# Patient Record
Sex: Male | Born: 1960 | Hispanic: No | Marital: Single | Smoking: Current every day smoker
Health system: Southern US, Community
[De-identification: ages and names within clinical notes are randomized; demographics above are authoritative.]

## PROBLEM LIST (undated history)

## (undated) DIAGNOSIS — R51 Headache: Secondary | ICD-10-CM

## (undated) DIAGNOSIS — J449 Chronic obstructive pulmonary disease, unspecified: Secondary | ICD-10-CM

## (undated) DIAGNOSIS — E119 Type 2 diabetes mellitus without complications: Secondary | ICD-10-CM

## (undated) DIAGNOSIS — I639 Cerebral infarction, unspecified: Secondary | ICD-10-CM

## (undated) DIAGNOSIS — T1490XA Injury, unspecified, initial encounter: Secondary | ICD-10-CM

## (undated) DIAGNOSIS — Z720 Tobacco use: Secondary | ICD-10-CM

## (undated) DIAGNOSIS — F1010 Alcohol abuse, uncomplicated: Secondary | ICD-10-CM

## (undated) HISTORY — DX: Headache: R51

## (undated) HISTORY — DX: Chronic obstructive pulmonary disease, unspecified: J44.9

## (undated) HISTORY — PX: ELBOW SURGERY: SHX618

## (undated) HISTORY — PX: HAND SURGERY: SHX662

## (undated) HISTORY — PX: VASECTOMY: SHX75

## (undated) HISTORY — DX: Cerebral infarction, unspecified: I63.9

## (undated) HISTORY — PX: SHOULDER SURGERY: SHX246

---

## 2002-08-12 ENCOUNTER — Emergency Department (HOSPITAL_COMMUNITY): Admission: EM | Admit: 2002-08-12 | Discharge: 2002-08-12 | Payer: Self-pay | Admitting: Emergency Medicine

## 2002-08-12 ENCOUNTER — Encounter: Payer: Self-pay | Admitting: Emergency Medicine

## 2003-11-28 ENCOUNTER — Ambulatory Visit: Admission: RE | Admit: 2003-11-28 | Discharge: 2003-11-28 | Payer: Self-pay | Admitting: Emergency Medicine

## 2009-02-15 ENCOUNTER — Ambulatory Visit (HOSPITAL_COMMUNITY): Admission: RE | Admit: 2009-02-15 | Discharge: 2009-02-15 | Payer: Self-pay | Admitting: Orthopedic Surgery

## 2012-12-17 DIAGNOSIS — E119 Type 2 diabetes mellitus without complications: Secondary | ICD-10-CM

## 2012-12-17 HISTORY — DX: Type 2 diabetes mellitus without complications: E11.9

## 2013-07-22 ENCOUNTER — Emergency Department (HOSPITAL_COMMUNITY): Payer: 59

## 2013-07-22 ENCOUNTER — Inpatient Hospital Stay (HOSPITAL_COMMUNITY)
Admission: EM | Admit: 2013-07-22 | Discharge: 2013-07-25 | DRG: 065 | Disposition: A | Discharge status: Home / Self Care | Payer: 59 | Attending: Internal Medicine | Admitting: Internal Medicine

## 2013-07-22 ENCOUNTER — Encounter (HOSPITAL_COMMUNITY): Payer: Self-pay | Admitting: Physical Medicine and Rehabilitation

## 2013-07-22 ENCOUNTER — Inpatient Hospital Stay (HOSPITAL_COMMUNITY): Payer: 59

## 2013-07-22 DIAGNOSIS — E785 Hyperlipidemia, unspecified: Secondary | ICD-10-CM

## 2013-07-22 DIAGNOSIS — I6350 Cerebral infarction due to unspecified occlusion or stenosis of unspecified cerebral artery: Principal | ICD-10-CM | POA: Diagnosis present

## 2013-07-22 DIAGNOSIS — R4701 Aphasia: Secondary | ICD-10-CM | POA: Diagnosis present

## 2013-07-22 DIAGNOSIS — F449 Dissociative and conversion disorder, unspecified: Secondary | ICD-10-CM | POA: Diagnosis present

## 2013-07-22 DIAGNOSIS — R4789 Other speech disturbances: Secondary | ICD-10-CM | POA: Diagnosis present

## 2013-07-22 DIAGNOSIS — F1010 Alcohol abuse, uncomplicated: Secondary | ICD-10-CM | POA: Diagnosis present

## 2013-07-22 DIAGNOSIS — I739 Peripheral vascular disease, unspecified: Secondary | ICD-10-CM | POA: Diagnosis present

## 2013-07-22 DIAGNOSIS — I6340 Cerebral infarction due to embolism of unspecified cerebral artery: Secondary | ICD-10-CM | POA: Diagnosis present

## 2013-07-22 DIAGNOSIS — R531 Weakness: Secondary | ICD-10-CM

## 2013-07-22 DIAGNOSIS — F3289 Other specified depressive episodes: Secondary | ICD-10-CM | POA: Diagnosis present

## 2013-07-22 DIAGNOSIS — M6281 Muscle weakness (generalized): Secondary | ICD-10-CM

## 2013-07-22 DIAGNOSIS — Z823 Family history of stroke: Secondary | ICD-10-CM

## 2013-07-22 DIAGNOSIS — F17200 Nicotine dependence, unspecified, uncomplicated: Secondary | ICD-10-CM | POA: Diagnosis present

## 2013-07-22 DIAGNOSIS — E119 Type 2 diabetes mellitus without complications: Secondary | ICD-10-CM | POA: Diagnosis present

## 2013-07-22 DIAGNOSIS — F329 Major depressive disorder, single episode, unspecified: Secondary | ICD-10-CM | POA: Diagnosis present

## 2013-07-22 DIAGNOSIS — R29898 Other symptoms and signs involving the musculoskeletal system: Secondary | ICD-10-CM | POA: Diagnosis present

## 2013-07-22 DIAGNOSIS — G8190 Hemiplegia, unspecified affecting unspecified side: Secondary | ICD-10-CM | POA: Diagnosis present

## 2013-07-22 DIAGNOSIS — I639 Cerebral infarction, unspecified: Secondary | ICD-10-CM

## 2013-07-22 DIAGNOSIS — I6529 Occlusion and stenosis of unspecified carotid artery: Secondary | ICD-10-CM | POA: Diagnosis present

## 2013-07-22 HISTORY — DX: Alcohol abuse, uncomplicated: F10.10

## 2013-07-22 HISTORY — DX: Injury, unspecified, initial encounter: T14.90XA

## 2013-07-22 HISTORY — DX: Tobacco use: Z72.0

## 2013-07-22 LAB — COMPREHENSIVE METABOLIC PANEL
ALT: 67 U/L — ABNORMAL HIGH (ref 0–53)
ALT: 66 U/L — ABNORMAL HIGH (ref 0–53)
AST: 32 U/L (ref 0–37)
AST: 35 U/L (ref 0–37)
Albumin: 4.1 g/dL (ref 3.5–5.2)
Albumin: 4.2 g/dL (ref 3.5–5.2)
Alkaline Phosphatase: 85 U/L (ref 39–117)
Alkaline Phosphatase: 84 U/L (ref 39–117)
BUN: 7 mg/dL (ref 6–23)
BUN: 7 mg/dL (ref 6–23)
CO2: 25 mEq/L (ref 19–32)
CO2: 25 mEq/L (ref 19–32)
Calcium: 9.5 mg/dL (ref 8.4–10.5)
Calcium: 9.6 mg/dL (ref 8.4–10.5)
Chloride: 100 mEq/L (ref 96–112)
Chloride: 100 mEq/L (ref 96–112)
Creatinine, Ser: 0.64 mg/dL (ref 0.50–1.35)
Creatinine, Ser: 0.64 mg/dL (ref 0.50–1.35)
GFR calc Af Amer: >90 mL/min (ref >90)
GFR calc Af Amer: >90 mL/min (ref >90)
GFR calc non Af Amer: >90 mL/min (ref >90)
GFR calc non Af Amer: >90 mL/min (ref >90)
Glucose, Bld: 208 mg/dL — ABNORMAL HIGH (ref 70–99)
Glucose, Bld: 213 mg/dL — ABNORMAL HIGH (ref 70–99)
Potassium: 3.6 mEq/L (ref 3.5–5.1)
Potassium: 3.5 mEq/L (ref 3.5–5.1)
Sodium: 137 mEq/L (ref 135–145)
Sodium: 138 mEq/L (ref 135–145)
Total Bilirubin: 0.3 mg/dL (ref 0.3–1.2)
Total Bilirubin: 0.3 mg/dL (ref 0.3–1.2)
Total Protein: 7.1 g/dL (ref 6.0–8.3)
Total Protein: 7.2 g/dL (ref 6.0–8.3)

## 2013-07-22 LAB — DIFFERENTIAL
Basophils Absolute: 0.1 10*3/uL (ref 0.0–0.1)
Basophils Relative: 1 % (ref 0–1)
Eosinophils Absolute: 0.3 10*3/uL (ref 0.0–0.7)
Eosinophils Relative: 3 % (ref 0–5)
Lymphocytes Relative: 36 % (ref 12–46)
Lymphs Abs: 3 10*3/uL (ref 0.7–4.0)
Monocytes Absolute: 0.6 10*3/uL (ref 0.1–1.0)
Monocytes Relative: 7 % (ref 3–12)
Neutro Abs: 4.4 10*3/uL (ref 1.7–7.7)
Neutrophils Relative %: 53 % (ref 43–77)

## 2013-07-22 LAB — POCT I-STAT TROPONIN I: Troponin i, poc: 0 ng/mL (ref 0.00–0.08)

## 2013-07-22 LAB — RAPID URINE DRUG SCREEN, HOSP PERFORMED
Amphetamines: NOT DETECTED
Barbiturates: NOT DETECTED
Benzodiazepines: NOT DETECTED
Cocaine: NOT DETECTED
Opiates: POSITIVE — AB
Tetrahydrocannabinol: NOT DETECTED

## 2013-07-22 LAB — TSH: TSH: 1.777 u[IU]/mL (ref 0.350–4.500)

## 2013-07-22 LAB — CBC
HCT: 39.6 % (ref 39.0–52.0)
Hemoglobin: 13.9 g/dL (ref 13.0–17.0)
MCH: 32.9 pg (ref 26.0–34.0)
MCHC: 35.1 g/dL (ref 30.0–36.0)
MCV: 93.6 fL (ref 78.0–100.0)
Platelets: 151 10*3/uL (ref 150–400)
RBC: 4.23 MIL/uL (ref 4.22–5.81)
RDW: 13.2 % (ref 11.5–15.5)
WBC: 8.3 10*3/uL (ref 4.0–10.5)

## 2013-07-22 LAB — PROTIME-INR
INR: 0.96 (ref 0.00–1.49)
Prothrombin Time: 12.6 seconds (ref 11.6–15.2)

## 2013-07-22 LAB — TROPONIN I: Troponin I: <0.3 ng/mL (ref <0.30)

## 2013-07-22 LAB — APTT: aPTT: 32 seconds (ref 24–37)

## 2013-07-22 MED ORDER — THIAMINE HCL 100 MG/ML IJ SOLN
Freq: Once | INTRAVENOUS | Status: AC
  Administered 2013-07-22: 22:00:00 via INTRAVENOUS
  Filled 2013-07-22: qty 1000

## 2013-07-22 MED ORDER — LORAZEPAM 2 MG/ML IJ SOLN: 1.0000 mg | Freq: Four times a day (QID) | INTRAMUSCULAR | Status: DC | PRN

## 2013-07-22 MED ORDER — ACETAMINOPHEN 650 MG RE SUPP: 650.0000 mg | RECTAL | Status: DC | PRN

## 2013-07-22 MED ORDER — ASPIRIN 300 MG RE SUPP
300.0000 mg | Freq: Every day | RECTAL | Status: DC
  Filled 2013-07-22 (×2): qty 1

## 2013-07-22 MED ORDER — LORAZEPAM 1 MG PO TABS: 1.0000 mg | ORAL_TABLET | Freq: Four times a day (QID) | ORAL | Status: DC | PRN

## 2013-07-22 MED ORDER — ASPIRIN 300 MG RE SUPP
300.0000 mg | Freq: Once | RECTAL | Status: AC
  Administered 2013-07-22: 300 mg via RECTAL
  Filled 2013-07-22: qty 1

## 2013-07-22 MED ORDER — ASPIRIN 325 MG PO TABS
325.0000 mg | ORAL_TABLET | Freq: Every day | ORAL | Status: DC
Start: 2013-07-22 — End: 2013-07-22
  Filled 2013-07-22: qty 1

## 2013-07-22 MED ORDER — IOHEXOL 350 MG/ML SOLN
100.0000 mL | Freq: Once | INTRAVENOUS | Status: AC | PRN
  Administered 2013-07-22: 100 mL via INTRAVENOUS

## 2013-07-22 MED ORDER — SENNOSIDES-DOCUSATE SODIUM 8.6-50 MG PO TABS: 1.0000 | ORAL_TABLET | Freq: Every evening | ORAL | Status: DC | PRN

## 2013-07-22 MED ORDER — NICOTINE 21 MG/24HR TD PT24
21.0000 mg | MEDICATED_PATCH | Freq: Every day | TRANSDERMAL | Status: DC
  Administered 2013-07-22 – 2013-07-25 (×4): 21 mg via TRANSDERMAL
  Filled 2013-07-22 (×4): qty 1

## 2013-07-22 MED ORDER — ACETAMINOPHEN 325 MG PO TABS
650.0000 mg | ORAL_TABLET | ORAL | Status: DC | PRN
  Administered 2013-07-23 – 2013-07-25 (×4): 650 mg via ORAL
  Filled 2013-07-22 (×4): qty 2

## 2013-07-22 NOTE — ED Provider Notes (Signed)
CSN: [id number]     Arrival date & time 07/22/13  0904 History     First MD Initiated Contact with Patient 07/22/13 0945     Chief Complaint  Patient presents with  . Headache  . Extremity Weakness    HPI Patient brought in by EMS after he began having confusion and right-sided weakness.  Friend of his noticed he had weakness in his right leg this morning when he came out of the car.  He then became more weak on the right side and had confusion and change in his speech.  History is difficult to obtain because of patient's inability to communicate.  No past medical history is known at this time. No past medical history on file. No past surgical history on file. History reviewed. No pertinent family history. History  Substance Use Topics  . Smoking status: Never Smoker   . Smokeless tobacco: Not on file  . Alcohol Use: No    Review of Systems  Unable to perform ROS: Mental status change    Allergies  Review of patient's allergies indicates no known allergies.  Home Medications   Current Outpatient Rx  Name  Route  Sig  Dispense  Refill  . Ascorbic Acid (VITAMIN C) 500 MG CAPS   Oral   Take 500 mg by mouth daily.         . B Complex-C (B-COMPLEX WITH VITAMIN C) tablet   Oral   Take 1 tablet by mouth daily.         Marland Kitchen HYDROcodone-acetaminophen (NORCO) 10-325 MG per tablet   Oral   Take 1 tablet by mouth every 3 (three) hours as needed for pain.         . meloxicam (MOBIC) 15 MG tablet   Oral   Take 15 mg by mouth daily.         . traZODone (DESYREL) 50 MG tablet   Oral   Take 50 mg by mouth at bedtime.         . vitamin E 400 UNIT capsule   Oral   Take 400 Units by mouth daily.          BP 152/81  Pulse 82  Temp(Src) 98.8 F (37.1 C) (Oral)  Resp 17  SpO2 100% Physical Exam  Nursing note and vitals reviewed. Constitutional: He appears well-developed and well-nourished. No distress.  HENT:  Head: Normocephalic and atraumatic.  Eyes: Pupils  are equal, round, and reactive to light.  Neck: Normal range of motion.  Cardiovascular: Normal rate and intact distal pulses.   Pulmonary/Chest: No respiratory distress.  Abdominal: Normal appearance. He exhibits no distension.  Musculoskeletal: Normal range of motion.  Neurological: He is alert. A cranial nerve deficit (Right-sided facial droop.) is present. GCS eye subscore is 4. GCS verbal subscore is 5. GCS motor subscore is 6.  Right-sided hemiparesis.  Skin: Skin is warm and dry. No rash noted.  Psychiatric: He has a normal mood and affect. His behavior is normal.    ED Course   Procedures (including critical care time) CRITICAL CARE Performed by: Nelva Nay L Total critical care time: 30 min Critical care time was exclusive of separately billable procedures and treating other patients. Critical care was necessary to treat or prevent imminent or life-threatening deterioration. Critical care was time spent personally by me on the following activities: development of treatment plan with patient and/or surrogate as well as nursing, discussions with consultants, evaluation of patient's response to treatment, examination of patient, obtaining  history from patient or surrogate, ordering and performing treatments and interventions, ordering and review of laboratory studies, ordering and review of radiographic studies, pulse oximetry and re-evaluation of patient's condition.  Labs Reviewed  COMPREHENSIVE METABOLIC PANEL - Abnormal; Notable for the following:    Glucose, Bld 208 (*)    ALT 67 (*)    All other components within normal limits  COMPREHENSIVE METABOLIC PANEL - Abnormal; Notable for the following:    Glucose, Bld 213 (*)    ALT 66 (*)    All other components within normal limits  PROTIME-INR  APTT  CBC  DIFFERENTIAL  TROPONIN I  URINE RAPID DRUG SCREEN (HOSP PERFORMED)  POCT I-STAT TROPONIN I   Ct Head Wo Contrast  07/22/2013   *RADIOLOGY REPORT*  Clinical Data:  Headache.  Extremity weakness.  CT HEAD WITHOUT CONTRAST  Technique:  Contiguous axial images were obtained from the base of the skull through the vertex without contrast.  Comparison: Head CT 08/01/2007.  Findings: There are patchy and confluent areas of decreased attenuation throughout the deep and periventricular white matter of the cerebral hemispheres bilaterally, most compatible with chronic microvascular ischemic disease. No acute intracranial abnormalities.  Specifically, no evidence of acute intracranial hemorrhage, no definite findings of acute/subacute cerebral ischemia, no mass, mass effect, hydrocephalus or abnormal intra or extra-axial fluid collections.  Visualized paranasal sinuses and mastoids are well pneumatized.  No acute displaced skull fractures are identified.  IMPRESSION: 1.  No acute intracranial abnormalities. 2.  Chronic microvascular ischemic changes in the cerebral white matter, as above.   Original Report Authenticated By: Trudie Reed, M.D.   1. CVA (cerebral infarction)   2. Right sided weakness     MDM  Neurology was consulted.  Nelia Shi, MD 07/22/13 1322

## 2013-07-22 NOTE — Progress Notes (Signed)
Called SLP, therapist was leaving for the day and advised RN to attempt bedside swallow screen.  Pt choked on water at first attempt, pt to be kept NPO until SLP can assess further.

## 2013-07-22 NOTE — ED Notes (Addendum)
Brandon Alvarez (ex-Fiance) sts is only family here  [phone] (cell phone)  [phone] ext [phone] (work) sts is at work from [employer]  Per [employer] pt was c/o of intermittent right sided facial numbness and right arm and leg for appx 10-20 min. Asked pt if wanted to go to ER pt denied sts had follow up appt for arm.   Brandon speaking to pt on phone. Pt tearful sts "Brandon" dysarthria present.

## 2013-07-22 NOTE — ED Notes (Signed)
Girlfriend called RN to room. Pt flailing left extremities and pulling at wires. Pt right arm has slight movement. RN to calm pt undressed put in gown and offered urinal. Pt did not urinate. Laying calm in bed. Spanish interpreter has gone home per girlfriend pt speaks fluent english.

## 2013-07-22 NOTE — Consult Note (Signed)
Referring Physician: Radford Pax    Chief Complaint: aphasia and right sided weakness  HPI:                                                                                                                                         Riel Hirschman is an 52 y.o. male who is in ED mute with right sided weakness.  History is not able to be obtained by patient and was obtained by his driver and EMS. Per driver, he stated "he was not feeling well yesterday morning and all day yesterday". He mentioned a HA and she noted he was dragging his right leg this morning which was not normal for him. He was brought to his orthopedic appointment where he was noted to have speech difficulty and was quickly brought to [facility] hospital .  On exam he is mute but at one point said "Pam" multiple times. He follows no verbal commands and and shows weakness in his right arm and leg.   Date last known well: Unable to determine Time last known well: Unable to determine tPA Given: No: unable to determine LSN  No past medical history on file-- patient is mute  No past surgical history on file--patient is mute  No pertinent family history-patient is mute  Social History:  reports that he has never smoked. He does not have any smokeless tobacco history on file. He reports that he does not drink alcohol or use illicit drugs.  Allergies: No Known Allergies  Medications:                                                                                                                           No current facility-administered medications for this encounter.   Current Outpatient Prescriptions  Medication Sig Dispense Refill  . Ascorbic Acid (VITAMIN C) 500 MG CAPS Take 500 mg by mouth daily.      . B Complex-C (B-COMPLEX WITH VITAMIN C) tablet Take 1 tablet by mouth daily.      Marland Kitchen HYDROcodone-acetaminophen (NORCO) 10-325 MG per tablet Take 1 tablet by mouth every 3 (three) hours as needed for pain.      . meloxicam (MOBIC) 15 MG tablet Take  15 mg by mouth daily.      . traZODone (DESYREL) 50 MG tablet Take 50 mg by mouth at bedtime.      Marland Kitchen  vitamin E 400 UNIT capsule Take 400 Units by mouth daily.         ROS:                                                                                                                                       History obtained from unobtainable from patient due to mental status and language barrier  General ROS: negative for - chills, fatigue, fever, night sweats, weight gain or weight loss Psychological ROS: negative for - behavioral disorder, hallucinations, memory difficulties, mood swings or suicidal ideation Ophthalmic ROS: negative for - blurry vision, double vision, eye pain or loss of vision ENT ROS: negative for - epistaxis, nasal discharge, oral lesions, sore throat, tinnitus or vertigo Allergy and Immunology ROS: negative for - hives or itchy/watery eyes Hematological and Lymphatic ROS: negative for - bleeding problems, bruising or swollen lymph nodes Endocrine ROS: negative for - galactorrhea, hair pattern changes, polydipsia/polyuria or temperature intolerance Respiratory ROS: negative for - cough, hemoptysis, shortness of breath or wheezing Cardiovascular ROS: negative for - chest pain, dyspnea on exertion, edema or irregular heartbeat Gastrointestinal ROS: negative for - abdominal pain, diarrhea, hematemesis, nausea/vomiting or stool incontinence Genito-Urinary ROS: negative for - dysuria, hematuria, incontinence or urinary frequency/urgency Musculoskeletal ROS: negative for - joint swelling or muscular weakness Neurological ROS: as noted in HPI Dermatological ROS: negative for rash and skin lesion changes  Neurologic Examination:                                                                                                      Blood pressure 168/97, pulse 70, temperature 98.8 F (37.1 C), temperature source Oral, resp. rate 18, SpO2 100.00%.  Mental Status: Alert, mute,  mimics some visual commands. He says "Pam" which is the name of a friend, but has no other verbal output.  Cranial Nerves: II: Discs flat bilaterally; Visual fields grossly normal--blinks to threat, pupils equal, round, reactive to light and accommodation III,IV, VI: ptosis not present, extra-ocular motions intact bilaterally V,VII: smile symmetric, facial light touch sensation normal bilaterally VIII: hearing normal bilaterally IX,X: gag reflex present XI: bilateral shoulder shrug XII: midline tongue extension Motor: Right : Upper extremity   3/5    Left:     Upper extremity   5/5  Lower extremity   3/5     Lower extremity   5/5 Tone and bulk:normal tone throughout; no atrophy noted Sensory: responds to nox stim x 4.  Deep Tendon Reflexes:  Right: Upper Extremity   Left: Upper extremity   biceps (C-5 to C-6) 2/4   biceps (C-5 to C-6) in cast tricep (C7) 2/4    triceps (C7) in cast Brachioradialis (C6) 2/4  Brachioradialis (C6) 2/4  Lower Extremity Lower Extremity  quadriceps (L-2 to L-4) 1/4   quadriceps (L-2 to L-4) 1/4 Achilles (S1) 0/4   Achilles (S1) 0/4  Plantars: Mute bilaterally Cerebellar: normal finger-to-nose CV: pulses palpable throughout    Results for orders placed during the hospital encounter of 07/22/13 (from the past 48 hour(s))  CBC     Status: None   Collection Time    07/22/13  9:55 AM      Result Value Range   WBC 8.3  4.0 - 10.5 K/uL   RBC 4.23  4.22 - 5.81 MIL/uL   Hemoglobin 13.9  13.0 - 17.0 g/dL   HCT 40.9  81.1 - 91.4 %   MCV 93.6  78.0 - 100.0 fL   MCH 32.9  26.0 - 34.0 pg   MCHC 35.1  30.0 - 36.0 g/dL   RDW 78.2  95.6 - 21.3 %   Platelets 151  150 - 400 K/uL  DIFFERENTIAL     Status: None   Collection Time    07/22/13  9:55 AM      Result Value Range   Neutrophils Relative % 53  43 - 77 %   Neutro Abs 4.4  1.7 - 7.7 K/uL   Lymphocytes Relative 36  12 - 46 %   Lymphs Abs 3.0  0.7 - 4.0 K/uL   Monocytes Relative 7  3 - 12 %    Monocytes Absolute 0.6  0.1 - 1.0 K/uL   Eosinophils Relative 3  0 - 5 %   Eosinophils Absolute 0.3  0.0 - 0.7 K/uL   Basophils Relative 1  0 - 1 %   Basophils Absolute 0.1  0.0 - 0.1 K/uL   Ct Head Wo Contrast  07/22/2013   *RADIOLOGY REPORT*  Clinical Data: Headache.  Extremity weakness.  CT HEAD WITHOUT CONTRAST  Technique:  Contiguous axial images were obtained from the base of the skull through the vertex without contrast.  Comparison: Head CT 08/01/2007.  Findings: There are patchy and confluent areas of decreased attenuation throughout the deep and periventricular white matter of the cerebral hemispheres bilaterally, most compatible with chronic microvascular ischemic disease. No acute intracranial abnormalities.  Specifically, no evidence of acute intracranial hemorrhage, no definite findings of acute/subacute cerebral ischemia, no mass, mass effect, hydrocephalus or abnormal intra or extra-axial fluid collections.  Visualized paranasal sinuses and mastoids are well pneumatized.  No acute displaced skull fractures are identified.  IMPRESSION: 1.  No acute intracranial abnormalities. 2.  Chronic microvascular ischemic changes in the cerebral white matter, as above.   Original Report Authenticated By: Trudie Reed, M.D.    Assessment and plan discussed with with attending physician and they are in agreement.    Felicie Morn PA-C [facility] [phone]  07/22/2013, 10:34 AM  I have seen and evaluated the patient. I have reviewed the above note and made appropriate changes.     Assessment: 52 y.o. male with right sided weakness and aphasia most consistent with left MCA infarct. Time of last seen normal is unknown and CTA did not show any inter venable lesion and therefore I do nto feel that there is any acute intervention to offer him. An MRI would be most helpful to ensure that ischemia is present.  Stroke Risk Factors - smoking  1. HgbA1c, fasting lipid panel 2. MRI the  brain without contrast, no MRA needed as CTA was done.  3. Frequent neuro checks 4. Echocardiogram 5. Carotid dopplers are not needed as CTA was done.  6. Prophylactic therapy-Antiplatelet med: Aspirin - dose 325mg  7. Risk factor modification 8. Telemetry monitoring 9. PT consult, OT consult, Speech consult   Ritta Slot, MD [facility] [phone]  If 7pm- 7am, please page neurology on call at [phone].

## 2013-07-22 NOTE — Progress Notes (Signed)
Pt is still nonverbal with the exception of saying his own name, barely moves any of his extremities.  Pt ex-girlfriend came out reporting that he voided in the urinal.  Upon assessment, pt is still at previously stated neuro status.

## 2013-07-22 NOTE — ED Notes (Signed)
Registration notified to update pt information with family/friends at bedside.

## 2013-07-22 NOTE — ED Notes (Signed)
Interpreter at bedside. No change in pt. Status

## 2013-07-22 NOTE — ED Notes (Addendum)
Pt presents to department for evaluation of headache and R sided weakness. States he began having headache yesterday and also states R sided numbness/weakness. Pt unable to recall exact time of onset, pt has intermittent confusion about events. Upon arrival he is confused, but able to answer some simple questions at present. 18g R hand. CBG 223. BP 180/120. No history of stroke.

## 2013-07-22 NOTE — ED Notes (Signed)
Pt transported to CT by RN- pt not verbally responsive initially. When moved to CT scanner pt sts "Brandon Alvarez" translator phones attempted. Pt staring at phone in hand. Pt laying still in CT scanner initially and physical translator arrived at CT. Pt still not verbally responsive and looking around room. Unable to follow simple commands. Pt moving left upper and lower limb. Dr. Amada Jupiter paged x2 for add on of CT angio neck. Did not respond. Dr. Pearlean Brownie gave verbal order for CT angio neck.

## 2013-07-22 NOTE — ED Notes (Signed)
ELENA MODERY (girlfriend)  [phone] (cell phone)

## 2013-07-22 NOTE — H&P (Signed)
Date: 07/22/2013               Patient Name:  Brandon Alvarez MRN: [id number]  DOB: Apr 29, 1961 Age / Sex: 52 y.o., male   PCP: No Pcp Per Patient         Medical Service: Internal Medicine Teaching Service         Attending Physician: Dr. Jonah Blue, DO    First Contact: Dr. Angelina Sheriff, MD Pager: [phone]  Second Contact: Dr. Dede Query, MD Pager: [phone]       After Hours (After 5p/  First Contact Pager: [phone]  weekends / holidays): Second Contact Pager: [phone]   Chief Complaint: Right sided weakness and speech difficulty  History of Present Illness: Brandon Alvarez is a 52 y.o. mane with a pmhx of tobacco abuse, alcohol abuse, and recent orthopedic surgery comes to the ED with a cc of right sided weakness and speech difficulty. As the patient is unable to speak, the majority of the history was taken from his ex-fiancee and current girlfriend. The patient was in his normal state of health until yesterday when he had 5-6 episodes of right sided numbness in his face and arm. These episodes lasted about twenty minutes and then completely resolved. He had no other symptoms during this times including h/a, n/v, f/c, or focal weakness. This morning the patient was able to go to his appointment with is orthopaedic surgeon. At this visit, the patient was found to be mute and had profound right-sided weakness. EMS was called to the clinic and brought him to the ED.  Meds: No current facility-administered medications for this encounter.   Current Outpatient Prescriptions  Medication Sig Dispense Refill  . Ascorbic Acid (VITAMIN C) 500 MG CAPS Take 500 mg by mouth daily.      . B Complex-C (B-COMPLEX WITH VITAMIN C) tablet Take 1 tablet by mouth daily.      Marland Kitchen HYDROcodone-acetaminophen (NORCO) 10-325 MG per tablet Take 1 tablet by mouth every 3 (three) hours as needed for pain.      . meloxicam (MOBIC) 15 MG tablet Take 15 mg by mouth daily.      . traZODone (DESYREL) 50 MG tablet Take 50 mg by  mouth at bedtime.      . vitamin E 400 UNIT capsule Take 400 Units by mouth daily.        Allergies: Allergies as of 07/22/2013  . (No Known Allergies)   Past Medical History  Diagnosis Date  . Testicular cancer     s/p resection  . Tobacco abuse   . Alcohol abuse   . Trauma     left arm (shoulder and elbow) s/p surgery   Past Surgical History  Procedure Laterality Date  . Elbow surgery Left     July 2014  . Orchiectomy     Family History  Problem Relation Age of Onset  . Stroke Maternal Grandmother    History   Social History  . Marital Status: Single    Spouse Name: N/A    Number of Children: N/A  . Years of Education: N/A   Occupational History  . Manual Labor     unemployed   Social History Main Topics  . Smoking status: Current Every Day Smoker -- 2.00 packs/day for 40 years    Types: Cigarettes  . Smokeless tobacco: Not on file  . Alcohol Use: 18.0 oz/week    30 Cans of beer per week  . Drug Use: Not on file  .  Sexually Active: Not on file   Other Topics Concern  . Not on file   Social History Narrative  . No narrative on file    Review of Systems:  Review of Systems  Constitutional: Negative for fever, chills and malaise/fatigue.  HENT: Positive for sore throat.   Eyes: Negative for blurred vision and double vision.  Respiratory: Negative for cough and shortness of breath.   Cardiovascular: Negative for chest pain, palpitations and leg swelling.  Gastrointestinal: Negative for nausea, vomiting, diarrhea and constipation.  Genitourinary: Negative for dysuria, urgency and frequency.  Neurological: Negative for headaches.       See hpi   Physical Exam: Blood pressure 145/85, pulse 74, temperature 98.8 F (37.1 C), temperature source Oral, resp. rate 16, SpO2 97.00%.  Physical Exam  Constitutional: He appears well-developed and well-nourished. No distress.  Very emotional man that is unable to speak. Is able to answer questions by squeezing  his left hand.  HENT:  Head: Normocephalic and atraumatic.  Mouth/Throat: Oropharynx is clear and moist. No oropharyngeal exudate.  Eyes: Conjunctivae and EOM are normal. No scleral icterus.  Cardiovascular: Normal rate, regular rhythm, normal heart sounds and intact distal pulses.  Exam reveals no friction rub.   No murmur heard. Pulmonary/Chest: Effort normal and breath sounds normal. No respiratory distress. He has no wheezes. He has no rales.  Neurological: He is alert.  See neurology consult for complete neurologic exam. Profound muscle weakness in the patients right upper and lower extremity. Complete Aphasia.  Skin: He is not diaphoretic.   Lab results: Basic Metabolic Panel:  Recent Labs  91/47/82 0955 07/22/13 1043  NA 137 138  K 3.6 3.5  CL 100 100  CO2 25 25  GLUCOSE 208* 213*  BUN 7 7  CREATININE 0.64 0.64  CALCIUM 9.5 9.6   Liver Function Tests:  Recent Labs  07/22/13 0955 07/22/13 1043  AST 32 35  ALT 67* 66*  ALKPHOS 85 84  BILITOT 0.3 0.3  PROT 7.1 7.2  ALBUMIN 4.1 4.2   CBC:  Recent Labs  07/22/13 0955  WBC 8.3  NEUTROABS 4.4  HGB 13.9  HCT 39.6  MCV 93.6  PLT 151   Cardiac Enzymes:  Recent Labs  07/22/13 0955  TROPONINI <0.30   Coagulation:  Recent Labs  07/22/13 0955  LABPROT 12.6  INR 0.96    Imaging results:  Ct Angio Head W/cm &/or Wo Cm  07/22/2013   *RADIOLOGY REPORT*  Clinical Data:  Right-sided weakness.  CT ANGIOGRAPHY HEAD AND NECK  Technique:  Multidetector CT imaging of the head and neck was performed using the standard protocol during bolus administration of intravenous contrast.  Multiplanar CT image reconstructions including MIPs were obtained to evaluate the vascular anatomy. Carotid stenosis measurements (when applicable) are obtained utilizing NASCET criteria, using the distal internal carotid diameter as the denominator.  Contrast: OMNIPAQUE IOHEXOL 350 MG/ML SOLN  Comparison:  07/22/2013 and 08/01/2007  unenhanced head CT.  CTA NECK  Findings:  Right carotid artery:  No significant narrowing of the common carotid artery.  Right carotid bifurcation mild plaque with mild narrowing of the proximal right internal carotid artery (less than 50%).  Left carotid artery:  No significant stenosis of the left common carotid artery.  Calcified focal plaque proximal left internal carotid artery with 54% diameter stenosis.  Mild narrowing proximal right subclavian artery.  Mild narrowing proximal right vertebral artery.  Right vertebral artery is the dominant vertebral artery.  Left vertebral artery  arises directly from the aortic arch.  Visualized lung apices are clear.  Right lobe of thyroid gland more prominent than the left without obvious mass although streak artifact limits evaluation.  Mild prominence palatine tonsils without obvious mass identified.  Scattered normal sized lymph nodes throughout the neck largest in the level II region.  Mild cervical spondylotic changes most prominent C6-7.  Caries.  Minimal mucosal thickening right maxillary sinus peri   Review of the MIP images confirms the above findings.  IMPRESSION:  Right carotid bifurcation mild plaque with mild narrowing of the proximal right internal carotid artery (less than 50%).  Calcified focal plaque proximal left internal carotid artery with 54% diameter stenosis.  Mild narrowing proximal right vertebral artery.  Right vertebral artery is the dominant vertebral artery.  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  CTA HEAD  Findings:  No obvious intracranial hemorrhage or enhancing lesion. No hydrocephalus.  No CT findings of large acute infarct.  Please see below.  Right vertebral artery is dominant.  Minimal irregularity of the basilar artery without high-grade stenosis.  Minimal branch vessel irregularity of the posterior circulation.  Calcified plaque cavernous segment of the internal carotid artery with mild  to moderate narrowing slightly greater on the left.  Minimal irregularity of the M1 segment of the middle cerebral artery bilaterally without high-grade stenosis.  No significant asymmetry of number of visualized middle cerebral artery branch vessels.  No significant narrowing of the anterior cerebral artery on either side.  No aneurysm or vascular malformation noted.   Review of the MIP images confirms the above findings.  IMPRESSION: Calcified plaque cavernous segment of the internal carotid artery with mild to moderate narrowing slightly greater on the left.  Minimal irregularity of the M1 segment of the middle cerebral artery bilaterally without high-grade stenosis.  No significant asymmetry of number of visualized middle cerebral artery branch vessels.  No significant narrowing of the anterior cerebral artery on either side.  Right vertebral artery is dominant.  Minimal irregularity of the basilar artery without high-grade stenosis.  Minimal branch vessel irregularity of the posterior circulation.  CT perfusion:  CT perfusion imaging was performed with computer-generated maps.  Findings:  No findings of large acute infarct.  Present examination incorporates from the mid centrum semiovale to the posterior fossa. The superior aspect of the supratentorial region is not included.  Impression:  No findings of large acute infarct.  Present examination incorporates from the mid centrum semiovale to the posterior fossa. The superior aspect of the supratentorial region is not included on perfusion imaging.  Critical Value/emergent results were called by telephone at the time of interpretation on 07/22/2013 at 12:54 p.m. to Dr. Amada Jupiter., who verbally acknowledged these results.   Original Report Authenticated By: Lacy Duverney, M.D.   Ct Head Wo Contrast  07/22/2013   *RADIOLOGY REPORT*  Clinical Data: Headache.  Extremity weakness.  CT HEAD WITHOUT CONTRAST  Technique:  Contiguous axial images were obtained from  the base of the skull through the vertex without contrast.  Comparison: Head CT 08/01/2007.  Findings: There are patchy and confluent areas of decreased attenuation throughout the deep and periventricular white matter of the cerebral hemispheres bilaterally, most compatible with chronic microvascular ischemic disease. No acute intracranial abnormalities.  Specifically, no evidence of acute intracranial hemorrhage, no definite findings of acute/subacute cerebral ischemia, no mass, mass effect, hydrocephalus or abnormal intra or extra-axial fluid collections.  Visualized paranasal sinuses and mastoids are well pneumatized.  No acute displaced skull fractures are identified.  IMPRESSION: 1.  No acute intracranial abnormalities. 2.  Chronic microvascular ischemic changes in the cerebral white matter, as above.   Original Report Authenticated By: Trudie Reed, M.D.   Ct Angio Neck W/cm &/or Wo/cm  07/22/2013   *RADIOLOGY REPORT*  Clinical Data:  Right-sided weakness.  CT ANGIOGRAPHY HEAD AND NECK  Technique:  Multidetector CT imaging of the head and neck was performed using the standard protocol during bolus administration of intravenous contrast.  Multiplanar CT image reconstructions including MIPs were obtained to evaluate the vascular anatomy. Carotid stenosis measurements (when applicable) are obtained utilizing NASCET criteria, using the distal internal carotid diameter as the denominator.  Contrast: OMNIPAQUE IOHEXOL 350 MG/ML SOLN  Comparison:  07/22/2013 and 08/01/2007 unenhanced head CT.  CTA NECK  Findings:  Right carotid artery:  No significant narrowing of the common carotid artery.  Right carotid bifurcation mild plaque with mild narrowing of the proximal right internal carotid artery (less than 50%).  Left carotid artery:  No significant stenosis of the left common carotid artery.  Calcified focal plaque proximal left internal carotid artery with 54% diameter stenosis.  Mild narrowing proximal  right subclavian artery.  Mild narrowing proximal right vertebral artery.  Right vertebral artery is the dominant vertebral artery.  Left vertebral artery arises directly from the aortic arch.  Visualized lung apices are clear.  Right lobe of thyroid gland more prominent than the left without obvious mass although streak artifact limits evaluation.  Mild prominence palatine tonsils without obvious mass identified.  Scattered normal sized lymph nodes throughout the neck largest in the level II region.  Mild cervical spondylotic changes most prominent C6-7.  Caries.  Minimal mucosal thickening right maxillary sinus peri   Review of the MIP images confirms the above findings.  IMPRESSION:  Right carotid bifurcation mild plaque with mild narrowing of the proximal right internal carotid artery (less than 50%).  Calcified focal plaque proximal left internal carotid artery with 54% diameter stenosis.  Mild narrowing proximal right vertebral artery.  Right vertebral artery is the dominant vertebral artery.  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  CTA HEAD  Findings:  No obvious intracranial hemorrhage or enhancing lesion. No hydrocephalus.  No CT findings of large acute infarct.  Please see below.  Right vertebral artery is dominant.  Minimal irregularity of the basilar artery without high-grade stenosis.  Minimal branch vessel irregularity of the posterior circulation.  Calcified plaque cavernous segment of the internal carotid artery with mild to moderate narrowing slightly greater on the left.  Minimal irregularity of the M1 segment of the middle cerebral artery bilaterally without high-grade stenosis.  No significant asymmetry of number of visualized middle cerebral artery branch vessels.  No significant narrowing of the anterior cerebral artery on either side.  No aneurysm or vascular malformation noted.   Review of the MIP images confirms the above findings.  IMPRESSION:  Calcified plaque cavernous segment of the internal carotid artery with mild to moderate narrowing slightly greater on the left.  Minimal irregularity of the M1 segment of the middle cerebral artery bilaterally without high-grade stenosis.  No significant asymmetry of number of visualized middle cerebral artery branch vessels.  No significant narrowing of the anterior cerebral artery on either side.  Right vertebral artery is dominant.  Minimal irregularity of the basilar artery without high-grade stenosis.  Minimal branch vessel irregularity of the posterior circulation.  CT perfusion:  CT  perfusion imaging was performed with computer-generated maps.  Findings:  No findings of large acute infarct.  Present examination incorporates from the mid centrum semiovale to the posterior fossa. The superior aspect of the supratentorial region is not included.  Impression:  No findings of large acute infarct.  Present examination incorporates from the mid centrum semiovale to the posterior fossa. The superior aspect of the supratentorial region is not included on perfusion imaging.  Critical Value/emergent results were called by telephone at the time of interpretation on 07/22/2013 at 12:54 p.m. to Dr. Amada Jupiter., who verbally acknowledged these results.   Original Report Authenticated By: Lacy Duverney, M.D.   Ct Cerebral Perfusion W/cm  07/22/2013   *RADIOLOGY REPORT*  Clinical Data:  Right-sided weakness.  CT ANGIOGRAPHY HEAD AND NECK  Technique:  Multidetector CT imaging of the head and neck was performed using the standard protocol during bolus administration of intravenous contrast.  Multiplanar CT image reconstructions including MIPs were obtained to evaluate the vascular anatomy. Carotid stenosis measurements (when applicable) are obtained utilizing NASCET criteria, using the distal internal carotid diameter as the denominator.  Contrast: OMNIPAQUE IOHEXOL 350 MG/ML SOLN  Comparison:  07/22/2013 and 08/01/2007  unenhanced head CT.  CTA NECK  Findings:  Right carotid artery:  No significant narrowing of the common carotid artery.  Right carotid bifurcation mild plaque with mild narrowing of the proximal right internal carotid artery (less than 50%).  Left carotid artery:  No significant stenosis of the left common carotid artery.  Calcified focal plaque proximal left internal carotid artery with 54% diameter stenosis.  Mild narrowing proximal right subclavian artery.  Mild narrowing proximal right vertebral artery.  Right vertebral artery is the dominant vertebral artery.  Left vertebral artery arises directly from the aortic arch.  Visualized lung apices are clear.  Right lobe of thyroid gland more prominent than the left without obvious mass although streak artifact limits evaluation.  Mild prominence palatine tonsils without obvious mass identified.  Scattered normal sized lymph nodes throughout the neck largest in the level II region.  Mild cervical spondylotic changes most prominent C6-7.  Caries.  Minimal mucosal thickening right maxillary sinus peri   Review of the MIP images confirms the above findings.  IMPRESSION:  Right carotid bifurcation mild plaque with mild narrowing of the proximal right internal carotid artery (less than 50%).  Calcified focal plaque proximal left internal carotid artery with 54% diameter stenosis.  Mild narrowing proximal right vertebral artery.  Right vertebral artery is the dominant vertebral artery.  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  CTA HEAD  Findings:  No obvious intracranial hemorrhage or enhancing lesion. No hydrocephalus.  No CT findings of large acute infarct.  Please see below.  Right vertebral artery is dominant.  Minimal irregularity of the basilar artery without high-grade stenosis.  Minimal branch vessel irregularity of the posterior circulation.  Calcified plaque cavernous segment of the internal carotid artery with mild  to moderate narrowing slightly greater on the left.  Minimal irregularity of the M1 segment of the middle cerebral artery bilaterally without high-grade stenosis.  No significant asymmetry of number of visualized middle cerebral artery branch vessels.  No significant narrowing of the anterior cerebral artery on either side.  No aneurysm or vascular malformation noted.   Review of the MIP images confirms the above findings.  IMPRESSION: Calcified plaque cavernous segment of the internal carotid artery with mild to moderate narrowing slightly greater on the left.  Minimal irregularity of the M1 segment of the middle cerebral artery bilaterally without high-grade stenosis.  No significant asymmetry of number of visualized middle cerebral artery branch vessels.  No significant narrowing of the anterior cerebral artery on either side.  Right vertebral artery is dominant.  Minimal irregularity of the basilar artery without high-grade stenosis.  Minimal branch vessel irregularity of the posterior circulation.  CT perfusion:  CT perfusion imaging was performed with computer-generated maps.  Findings:  No findings of large acute infarct.  Present examination incorporates from the mid centrum semiovale to the posterior fossa. The superior aspect of the supratentorial region is not included.  Impression:  No findings of large acute infarct.  Present examination incorporates from the mid centrum semiovale to the posterior fossa. The superior aspect of the supratentorial region is not included on perfusion imaging.  Critical Value/emergent results were called by telephone at the time of interpretation on 07/22/2013 at 12:54 p.m. to Dr. Amada Jupiter., who verbally acknowledged these results.   Original Report Authenticated By: Lacy Duverney, M.D.    Assessment & Plan by Problem: Principal Problem:   Right sided weakness  Assessment Plan  Right Sided Weakness with Aphasia The patients sx's are consistent with a left MCA  CVA. He was considered outside the treatment window. Seizures are unlikely. Non-hemorrhagic as CT negative. 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 - F/U HgbA1c, fasting lipid panel, TSH  - F/U MRI the brain without contrast - Neuro checks q 2 for 12, then q 4 - F/U Echocardiogram  - Carotid dopplers are not needed as CTA was done.  - Prophylactic therapy-Antiplatelet med: Aspirin - dose 325mg   - Risk factor modification  - Telemetry monitoring  - PT consult, OT consult, Speech consult   Alcohol Abuse Patient consumes about 18 oz of alcohol/ week - CIWA protocol  Tobacco Abuse - Nicotine patch  DVT/Nutrition SCDs(Plan to switch to heparin after stroek w/u)/NPO     Dispo: Disposition is deferred at this time, awaiting improvement of current medical problems. Anticipated discharge in approximately 1-2 day(s).   The patient does not have a current PCP (No Pcp Per Patient) and does need an [facility] hospital follow-up appointment after discharge.  The patient does have transportation limitations that hinder transportation to clinic appointments.  Signed: Pleas Koch, MD 07/22/2013, 3:20 PM

## 2013-07-22 NOTE — ED Notes (Signed)
Neurology at the bedside to consult.

## 2013-07-22 NOTE — ED Notes (Signed)
Pam (ex-fiance/friend) at bedside. Updated on plan of care.

## 2013-07-23 ENCOUNTER — Inpatient Hospital Stay (HOSPITAL_COMMUNITY): Payer: 59

## 2013-07-23 DIAGNOSIS — I6350 Cerebral infarction due to unspecified occlusion or stenosis of unspecified cerebral artery: Principal | ICD-10-CM

## 2013-07-23 DIAGNOSIS — E785 Hyperlipidemia, unspecified: Secondary | ICD-10-CM

## 2013-07-23 LAB — BASIC METABOLIC PANEL
BUN: 9 mg/dL (ref 6–23)
CO2: 25 mEq/L (ref 19–32)
Chloride: 103 mEq/L (ref 96–112)
Creatinine, Ser: 0.66 mg/dL (ref 0.50–1.35)
GFR calc Af Amer: >90 mL/min (ref >90)
Glucose, Bld: 171 mg/dL — ABNORMAL HIGH (ref 70–99)

## 2013-07-23 LAB — CBC
HCT: 41.9 % (ref 39.0–52.0)
MCH: 32.7 pg (ref 26.0–34.0)
MCHC: 35.1 g/dL (ref 30.0–36.0)
MCV: 93.1 fL (ref 78.0–100.0)
RDW: 13.2 % (ref 11.5–15.5)

## 2013-07-23 LAB — LIPID PANEL
Cholesterol: 214 mg/dL — ABNORMAL HIGH (ref 0–200)
Triglycerides: 380 mg/dL — ABNORMAL HIGH (ref <150)
VLDL: 76 mg/dL — ABNORMAL HIGH (ref 0–40)

## 2013-07-23 MED ORDER — KETOROLAC TROMETHAMINE 30 MG/ML IJ SOLN
30.0000 mg | Freq: Once | INTRAMUSCULAR | Status: AC
  Administered 2013-07-23: 30 mg via INTRAVENOUS
  Filled 2013-07-23: qty 1

## 2013-07-23 NOTE — Progress Notes (Signed)
Echo Lab  2D Echocardiogram completed.  Lealand Elting L Shivank Pinedo, RDCS 07/23/2013 11:42 AM

## 2013-07-23 NOTE — Progress Notes (Signed)
See my separate note.

## 2013-07-23 NOTE — Clinical Documentation Improvement (Signed)
THIS DOCUMENT IS NOT A PERMANENT PART OF THE MEDICAL RECORD  Please update your documentation with the medical record to reflect your response to this query. If you need help knowing how to do this please call [phone].  07/23/13   Dear Dr. Amada Jupiter,  Noted multiple times in chart: "right sided weakness" in patient with probable MCA territory embolic CVA. In the coding world "right sided weakness" is a low weighted and general term. Please clarify in Notes and DC summary with a more specific term to more clearly show the patient's severity of illness and risk of mortality. Thank you.  Possible Clinical Conditions? - hemiparesis - hemiplegia - weakness - other (please specify)    You may use possible, probable, or suspect with inpatient documentation. possible, probable, suspected diagnoses MUST be documented at the time of discharge  Reviewed: additional documentation in the medical record  Thank You,  Beverley Fiedler RN BSN Clinical Documentation Specialist: [phone] Health Information Management [place]

## 2013-07-23 NOTE — Progress Notes (Signed)
Utilization review completed. Princess Karnes, RN, BSN. 

## 2013-07-23 NOTE — Progress Notes (Addendum)
Subjective: Some improvement in speech. Per friend, had several episodes on 08/05 of transient right sided numbness prior to this event.   tpa given: no, outside of window, unclear time of onset.   Exam: Filed Vitals:   07/23/13 0510  BP: 117/67  Pulse: 86  Temp: 98.1 F (36.7 C)  Resp: 19   Gen: In bed, NAD MS: Awake, Alert, short answers, but does answer some questions. follows commands RU:EAVWU, VFF Motor: 5/5 on left, 4-/5 in right arm, 3/5 in right leg.  Sensory:decerased in leg to LT  LDL 105  Impression: 52 yo M with acute loss of speech and right hemiparesis. I suspect acute stroke, but CTP was negative. MRI pending.   Recommendations: 1. LDL > 100, will start lipitor if MRI + for stroke. 2. Frequent neuro checks 3. Echocardiogram 4. Prophylactic therapy-Antiplatelet med: Aspirin - dose 325mg  7. Risk factor modification 8. Telemetry monitoring 9. PT consult, OT consult, Speech consult   Ritta Slot, MD [facility] [phone]  If 7pm- 7am, please page neurology on call at [phone].

## 2013-07-23 NOTE — Progress Notes (Signed)
Subjective:  NAE overnight. The patient has had significant improvement overnight. He is able to speak, but continues to be very confused. He has begun to be able to move his right hand and toes.  Objective: Vital signs in last 24 hours: Filed Vitals:   07/23/13 0510 07/23/13 0851 07/23/13 0924 07/23/13 1352  BP: 117/67  132/77 124/71  Pulse: 86  89 97  Temp: 98.1 F (36.7 C)  98 F (36.7 C) 98.6 F (37 C)  TempSrc: Oral  Oral Oral  Resp: 19  18 20   Height:  5\' 9"  (1.753 m)    Weight:  205 lb 7.5 oz (93.2 kg)    SpO2: 97%  99% 98%   Weight change:   Intake/Output Summary (Last 24 hours) at 07/23/13 1511 Last data filed at 07/23/13 0300  Gross per 24 hour  Intake      0 ml  Output   1350 ml  Net  -1350 ml  Physical Exam  Constitutional: He appears well-developed and well-nourished. No distress.  Very emotional man that is able to speak with difficulty. Is able to answer questions and appears to have trouble comprehending what is said. HENT:  Head: Normocephalic and atraumatic.  Mouth/Throat: Oropharynx is clear and moist. No oropharyngeal exudate.  Eyes: Conjunctivae and EOM are normal. No scleral icterus.  Cardiovascular: Normal rate, regular rhythm, normal heart sounds and intact distal pulses. Exam reveals no friction rub.  No murmur heard.  Pulmonary/Chest: Effort normal and breath sounds normal. No respiratory distress. He has no wheezes. He has no rales.  Neurological: He is alert.  See neurology consult for complete neurologic exam. Profound muscle weakness in the patients right upper and lower extremity. He is able to move his right hand and wiggle his right toes. Able to speak Skin: He is not diaphoretic.    Lab Results: Basic Metabolic Panel:  Recent Labs Lab 07/22/13 1043 07/23/13 0930  NA 138 139  K 3.5 3.5  CL 100 103  CO2 25 25  GLUCOSE 213* 171*  BUN 7 9  CREATININE 0.64 0.66  CALCIUM 9.6 9.5   Liver Function Tests:  Recent Labs Lab  07/22/13 0955 07/22/13 1043  AST 32 35  ALT 67* 66*  ALKPHOS 85 84  BILITOT 0.3 0.3  PROT 7.1 7.2  ALBUMIN 4.1 4.2   Recent Labs Lab 07/22/13 0955 07/23/13 0930  WBC 8.3 8.5  NEUTROABS 4.4  --   HGB 13.9 14.7  HCT 39.6 41.9  MCV 93.6 93.1  PLT 151 160   Cardiac Enzymes:  Recent Labs Lab 07/22/13 0955  TROPONINI <0.30  Fasting Lipid Panel:  Recent Labs Lab 07/23/13 0500  CHOL 214*  HDL 33*  LDLCALC 105*  TRIG 380*  CHOLHDL 6.5   Thyroid Function Tests:  Recent Labs Lab 07/22/13 1805  TSH 1.777   Coagulation:  Recent Labs Lab 07/22/13 0955  LABPROT 12.6  INR 0.96   Urine Drug Screen: Drugs of Abuse     Component Value Date/Time   LABOPIA POSITIVE* 07/22/2013 1501   COCAINSCRNUR NONE DETECTED 07/22/2013 1501   LABBENZ NONE DETECTED 07/22/2013 1501   AMPHETMU NONE DETECTED 07/22/2013 1501   THCU NONE DETECTED 07/22/2013 1501   LABBARB NONE DETECTED 07/22/2013 1501    Studies/Results: Ct Angio Head W/cm &/or Wo Cm  07/22/2013   *RADIOLOGY REPORT*  Clinical Data:  Right-sided weakness.  CT ANGIOGRAPHY HEAD AND NECK  Technique:  Multidetector CT imaging of the head and neck was  performed using the standard protocol during bolus administration of intravenous contrast.  Multiplanar CT image reconstructions including MIPs were obtained to evaluate the vascular anatomy. Carotid stenosis measurements (when applicable) are obtained utilizing NASCET criteria, using the distal internal carotid diameter as the denominator.  Contrast: OMNIPAQUE IOHEXOL 350 MG/ML SOLN  Comparison:  07/22/2013 and 08/01/2007 unenhanced head CT.  CTA NECK  Findings:  Right carotid artery:  No significant narrowing of the common carotid artery.  Right carotid bifurcation mild plaque with mild narrowing of the proximal right internal carotid artery (less than 50%).  Left carotid artery:  No significant stenosis of the left common carotid artery.  Calcified focal plaque proximal left internal  carotid artery with 54% diameter stenosis.  Mild narrowing proximal right subclavian artery.  Mild narrowing proximal right vertebral artery.  Right vertebral artery is the dominant vertebral artery.  Left vertebral artery arises directly from the aortic arch.  Visualized lung apices are clear.  Right lobe of thyroid gland more prominent than the left without obvious mass although streak artifact limits evaluation.  Mild prominence palatine tonsils without obvious mass identified.  Scattered normal sized lymph nodes throughout the neck largest in the level II region.  Mild cervical spondylotic changes most prominent C6-7.  Caries.  Minimal mucosal thickening right maxillary sinus peri   Review of the MIP images confirms the above findings.  IMPRESSION:  Right carotid bifurcation mild plaque with mild narrowing of the proximal right internal carotid artery (less than 50%).  Calcified focal plaque proximal left internal carotid artery with 54% diameter stenosis.  Mild narrowing proximal right vertebral artery.  Right vertebral artery is the dominant vertebral artery.  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  CTA HEAD  Findings:  No obvious intracranial hemorrhage or enhancing lesion. No hydrocephalus.  No CT findings of large acute infarct.  Please see below.  Right vertebral artery is dominant.  Minimal irregularity of the basilar artery without high-grade stenosis.  Minimal branch vessel irregularity of the posterior circulation.  Calcified plaque cavernous segment of the internal carotid artery with mild to moderate narrowing slightly greater on the left.  Minimal irregularity of the M1 segment of the middle cerebral artery bilaterally without high-grade stenosis.  No significant asymmetry of number of visualized middle cerebral artery branch vessels.  No significant narrowing of the anterior cerebral artery on either side.  No aneurysm or vascular malformation noted.    Review of the MIP images confirms the above findings.  IMPRESSION: Calcified plaque cavernous segment of the internal carotid artery with mild to moderate narrowing slightly greater on the left.  Minimal irregularity of the M1 segment of the middle cerebral artery bilaterally without high-grade stenosis.  No significant asymmetry of number of visualized middle cerebral artery branch vessels.  No significant narrowing of the anterior cerebral artery on either side.  Right vertebral artery is dominant.  Minimal irregularity of the basilar artery without high-grade stenosis.  Minimal branch vessel irregularity of the posterior circulation.  CT perfusion:  CT perfusion imaging was performed with computer-generated maps.  Findings:  No findings of large acute infarct.  Present examination incorporates from the mid centrum semiovale to the posterior fossa. The superior aspect of the supratentorial region is not included.  Impression:  No findings of large acute infarct.  Present examination incorporates from the mid centrum semiovale to the posterior fossa. The superior aspect of the supratentorial region is not included on perfusion imaging.  Critical Value/emergent results were called by telephone at the time of interpretation on 07/22/2013 at 12:54 p.m. to Dr. Amada Jupiter., who verbally acknowledged these results.   Original Report Authenticated By: Lacy Duverney, M.D.   Dg Chest 2 View  07/22/2013   *RADIOLOGY REPORT*  Clinical Data: Stroke  CHEST - 2 VIEW  Comparison: Prior chest x-ray 08/01/2007  Findings: Very low inspiratory volumes with crowding of the pulmonary vasculature.  Interstitial prominence is exaggerated compared to prior.  Cardiac and mediastinal contours remain within normal limits.  No pneumothorax or pleural effusion.  IMPRESSION: Very low inspiratory volumes with bibasilar atelectasis and crowding of the pulmonary vasculature.   Original Report Authenticated By: Malachy Moan, M.D.   Ct Head  Wo Contrast  07/22/2013   *RADIOLOGY REPORT*  Clinical Data: Headache.  Extremity weakness.  CT HEAD WITHOUT CONTRAST  Technique:  Contiguous axial images were obtained from the base of the skull through the vertex without contrast.  Comparison: Head CT 08/01/2007.  Findings: There are patchy and confluent areas of decreased attenuation throughout the deep and periventricular white matter of the cerebral hemispheres bilaterally, most compatible with chronic microvascular ischemic disease. No acute intracranial abnormalities.  Specifically, no evidence of acute intracranial hemorrhage, no definite findings of acute/subacute cerebral ischemia, no mass, mass effect, hydrocephalus or abnormal intra or extra-axial fluid collections.  Visualized paranasal sinuses and mastoids are well pneumatized.  No acute displaced skull fractures are identified.  IMPRESSION: 1.  No acute intracranial abnormalities. 2.  Chronic microvascular ischemic changes in the cerebral white matter, as above.   Original Report Authenticated By: Trudie Reed, M.D.   Ct Angio Neck W/cm &/or Wo/cm  07/22/2013   *RADIOLOGY REPORT*  Clinical Data:  Right-sided weakness.  CT ANGIOGRAPHY HEAD AND NECK  Technique:  Multidetector CT imaging of the head and neck was performed using the standard protocol during bolus administration of intravenous contrast.  Multiplanar CT image reconstructions including MIPs were obtained to evaluate the vascular anatomy. Carotid stenosis measurements (when applicable) are obtained utilizing NASCET criteria, using the distal internal carotid diameter as the denominator.  Contrast: OMNIPAQUE IOHEXOL 350 MG/ML SOLN  Comparison:  07/22/2013 and 08/01/2007 unenhanced head CT.  CTA NECK  Findings:  Right carotid artery:  No significant narrowing of the common carotid artery.  Right carotid bifurcation mild plaque with mild narrowing of the proximal right internal carotid artery (less than 50%).  Left carotid artery:  No  significant stenosis of the left common carotid artery.  Calcified focal plaque proximal left internal carotid artery with 54% diameter stenosis.  Mild narrowing proximal right subclavian artery.  Mild narrowing proximal right vertebral artery.  Right vertebral artery is the dominant vertebral artery.  Left vertebral artery arises directly from the aortic arch.  Visualized lung apices are clear.  Right lobe of thyroid gland more prominent than the left without obvious mass although streak artifact limits evaluation.  Mild prominence palatine tonsils without obvious mass identified.  Scattered normal sized lymph nodes throughout the neck largest in the level II region.  Mild cervical spondylotic changes most prominent C6-7.  Caries.  Minimal mucosal thickening right maxillary sinus peri   Review of the MIP images confirms the above findings.  IMPRESSION:  Right carotid bifurcation mild plaque with mild narrowing of the proximal right internal carotid artery (less than 50%).  Calcified focal plaque proximal left internal carotid artery with 54% diameter stenosis.  Mild narrowing proximal right vertebral artery.  Right vertebral artery is the dominant  vertebral artery.  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  CTA HEAD  Findings:  No obvious intracranial hemorrhage or enhancing lesion. No hydrocephalus.  No CT findings of large acute infarct.  Please see below.  Right vertebral artery is dominant.  Minimal irregularity of the basilar artery without high-grade stenosis.  Minimal branch vessel irregularity of the posterior circulation.  Calcified plaque cavernous segment of the internal carotid artery with mild to moderate narrowing slightly greater on the left.  Minimal irregularity of the M1 segment of the middle cerebral artery bilaterally without high-grade stenosis.  No significant asymmetry of number of visualized middle cerebral artery branch vessels.  No significant  narrowing of the anterior cerebral artery on either side.  No aneurysm or vascular malformation noted.   Review of the MIP images confirms the above findings.  IMPRESSION: Calcified plaque cavernous segment of the internal carotid artery with mild to moderate narrowing slightly greater on the left.  Minimal irregularity of the M1 segment of the middle cerebral artery bilaterally without high-grade stenosis.  No significant asymmetry of number of visualized middle cerebral artery branch vessels.  No significant narrowing of the anterior cerebral artery on either side.  Right vertebral artery is dominant.  Minimal irregularity of the basilar artery without high-grade stenosis.  Minimal branch vessel irregularity of the posterior circulation.  CT perfusion:  CT perfusion imaging was performed with computer-generated maps.  Findings:  No findings of large acute infarct.  Present examination incorporates from the mid centrum semiovale to the posterior fossa. The superior aspect of the supratentorial region is not included.  Impression:  No findings of large acute infarct.  Present examination incorporates from the mid centrum semiovale to the posterior fossa. The superior aspect of the supratentorial region is not included on perfusion imaging.  Critical Value/emergent results were called by telephone at the time of interpretation on 07/22/2013 at 12:54 p.m. to Dr. Amada Jupiter., who verbally acknowledged these results.   Original Report Authenticated By: Lacy Duverney, M.D.   Ct Cerebral Perfusion W/cm  07/22/2013   *RADIOLOGY REPORT*  Clinical Data:  Right-sided weakness.  CT ANGIOGRAPHY HEAD AND NECK  Technique:  Multidetector CT imaging of the head and neck was performed using the standard protocol during bolus administration of intravenous contrast.  Multiplanar CT image reconstructions including MIPs were obtained to evaluate the vascular anatomy. Carotid stenosis measurements (when applicable) are obtained  utilizing NASCET criteria, using the distal internal carotid diameter as the denominator.  Contrast: OMNIPAQUE IOHEXOL 350 MG/ML SOLN  Comparison:  07/22/2013 and 08/01/2007 unenhanced head CT.  CTA NECK  Findings:  Right carotid artery:  No significant narrowing of the common carotid artery.  Right carotid bifurcation mild plaque with mild narrowing of the proximal right internal carotid artery (less than 50%).  Left carotid artery:  No significant stenosis of the left common carotid artery.  Calcified focal plaque proximal left internal carotid artery with 54% diameter stenosis.  Mild narrowing proximal right subclavian artery.  Mild narrowing proximal right vertebral artery.  Right vertebral artery is the dominant vertebral artery.  Left vertebral artery arises directly from the aortic arch.  Visualized lung apices are clear.  Right lobe of thyroid gland more prominent than the left without obvious mass although streak artifact limits evaluation.  Mild prominence palatine tonsils without obvious mass identified.  Scattered normal sized lymph nodes throughout the neck largest in the level II region.  Mild cervical spondylotic changes most prominent  C6-7.  Caries.  Minimal mucosal thickening right maxillary sinus peri   Review of the MIP images confirms the above findings.  IMPRESSION:  Right carotid bifurcation mild plaque with mild narrowing of the proximal right internal carotid artery (less than 50%).  Calcified focal plaque proximal left internal carotid artery with 54% diameter stenosis.  Mild narrowing proximal right vertebral artery.  Right vertebral artery is the dominant vertebral artery.  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  CTA HEAD  Findings:  No obvious intracranial hemorrhage or enhancing lesion. No hydrocephalus.  No CT findings of large acute infarct.  Please see below.  Right vertebral artery is dominant.  Minimal irregularity of the basilar  artery without high-grade stenosis.  Minimal branch vessel irregularity of the posterior circulation.  Calcified plaque cavernous segment of the internal carotid artery with mild to moderate narrowing slightly greater on the left.  Minimal irregularity of the M1 segment of the middle cerebral artery bilaterally without high-grade stenosis.  No significant asymmetry of number of visualized middle cerebral artery branch vessels.  No significant narrowing of the anterior cerebral artery on either side.  No aneurysm or vascular malformation noted.   Review of the MIP images confirms the above findings.  IMPRESSION: Calcified plaque cavernous segment of the internal carotid artery with mild to moderate narrowing slightly greater on the left.  Minimal irregularity of the M1 segment of the middle cerebral artery bilaterally without high-grade stenosis.  No significant asymmetry of number of visualized middle cerebral artery branch vessels.  No significant narrowing of the anterior cerebral artery on either side.  Right vertebral artery is dominant.  Minimal irregularity of the basilar artery without high-grade stenosis.  Minimal branch vessel irregularity of the posterior circulation.  CT perfusion:  CT perfusion imaging was performed with computer-generated maps.  Findings:  No findings of large acute infarct.  Present examination incorporates from the mid centrum semiovale to the posterior fossa. The superior aspect of the supratentorial region is not included.  Impression:  No findings of large acute infarct.  Present examination incorporates from the mid centrum semiovale to the posterior fossa. The superior aspect of the supratentorial region is not included on perfusion imaging.  Critical Value/emergent results were called by telephone at the time of interpretation on 07/22/2013 at 12:54 p.m. to Dr. Amada Jupiter., who verbally acknowledged these results.   Original Report Authenticated By: Lacy Duverney, M.D.    Medications: I have reviewed the patient's current medications. Scheduled Meds: . aspirin  300 mg Rectal Daily  . 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 He was considered outside the treatment window. Seizures are unlikely. Non-hemorrhagic as CT negative. 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  HgbA1c Pending, fasting lipid panel LDL 105, TSH Normal  - F/U MRI the brain without contrast  - Neuro checks q 4  - F/U Echocardiogram   - Prophylactic therapy-Antiplatelet med: Aspirin - dose 325mg   - Risk factor modification  - Telemetry monitoring  - PT consult, OT consult, Speech consult   Left Arm Pain Likely 2/2 to recent surgery - tylenol prn  Alcohol Abuse  Patient consumes about 18 oz of alcohol/ week  - CIWA  protocol   Tobacco Abuse  - Nicotine patch   DVT/Nutrition  SCDs(Plan to switch to heparin after stroek w/u)/NPO       Dispo: Disposition is deferred at this time, awaiting improvement of current medical problems.  Anticipated discharge in approximately 2-4 day(s).   The patient does not have a current PCP (No Pcp Per Patient) and does need an [facility] hospital follow-up appointment after discharge.  The patient does have transportation limitations that hinder transportation to clinic appointments.  .Services Needed at time of discharge: Y = Yes, Blank = No PT:   OT:   RN:   Equipment:   Other:     LOS: 1 day   Pleas Koch, MD 07/23/2013, 3:11 PM

## 2013-07-23 NOTE — Progress Notes (Signed)
8/7  Recommend checking HgbA1C since admission blood sugar was 213 mg/dl.  No history of diabetes noted.    Smith Mince RN BSN CDE

## 2013-07-23 NOTE — Progress Notes (Signed)
Agree with below note. Linna Hoff PT, DPT Pager: [phone]

## 2013-07-23 NOTE — H&P (Signed)
INTERNAL MEDICINE TEACHING SERVICE Attending Admission Note  Date: 07/23/2013  Patient name: Altin Sease  Medical record number: [id number]  Date of birth: 1961/08/17    I have seen and evaluated Dedra Skeens and discussed their care with the Residency Team.  52 yr. Old male w/ hx tobacco abuse presented with right sided weakness and aphasia.  He admitted to have RUE weakness and numbness over the previous 24 hrs associated with right facial numbness as multiple episodes that resolved.  He was sent to the ED when he was noted by his orthopedic surgeon to have aphasia and right sided weakness.  The ED determined he was not a tPA candidate. Neurology is following. ASA PR for now. MRI brain pending.  Echo pending. CTA reviewed.  This morning he still has difficulty with speech and unchanged weakness of his RUE. Will need statin therapy if MRI positive for CVA.  Calculate 10 year risk with pooled corhort calculator as well.  PT, OT, SLP consults. Echo pending.      Jonah Blue, DO 8/7/20143:00 PM

## 2013-07-23 NOTE — Progress Notes (Deleted)
Entered in error

## 2013-07-23 NOTE — Evaluation (Signed)
Speech Language Pathology Evaluation Patient Details Name: Brandon Alvarez MRN: [id number] DOB: 12/13/1961 Today's Date: 07/23/2013 Time: 4098-1191 SLP Time Calculation (min): 30 min  Problem List:  Patient Active Problem List   Diagnosis Date Noted  . Right sided weakness 07/22/2013   Past Medical History:  Past Medical History  Diagnosis Date  . Testicular cancer     s/p resection  . Tobacco abuse   . Alcohol abuse   . Trauma     left arm (shoulder and elbow) s/p surgery   Past Surgical History:  Past Surgical History  Procedure Laterality Date  . Elbow surgery Left     July 2014  . Orchiectomy     HPI:  52 yo M with acute loss of speech and right sided weakness. MD suspects acute stroke, but CT was negative. MRI pending.    Assessment / Plan / Recommendation Clinical Impression  Pt demonstrated moderate expressive and receptive impairment consistent with aphasia. He is able to follow one step verbal commands with 50% accuracy when given in Spanish with a 15 second delay initiation. He also responds well to gestural cues and models. He verbalized some automatic responses to questions up to phrase level. His attention is also briefly sustained to functional and verbal tasks and he requires mod assist with problem solving related to deficits. Pt will need acute SLP services for functional communication and participation in ADLs. Recommend CIR consult.     SLP Assessment  Patient needs continued Speech Lanaguage Pathology Services    Follow Up Recommendations  Inpatient Rehab    Frequency and Duration min 2x/week  2 weeks   Pertinent Vitals/Pain NA   SLP Goals  SLP Goals Progress/Goals/Alternative treatment plan discussed with pt/caregiver and they: Agree SLP Goal #1: Pt will follow one step verbal commands with better than 80% accuracy with min cues.  SLP Goal #1 - Progress: Progressing toward goal SLP Goal #2: Pt will express basic wants and needs using total  communication techniques with max verbal/gestural cues x3 during session.  SLP Goal #2 - Progress: Progressing toward goal SLP Goal #3: Pt will name familiar objects with max verbal and visual cues x3.  SLP Goal #3 - Progress: Progressing toward goal  SLP Evaluation Prior Functioning  Cognitive/Linguistic Baseline: Within functional limits  Lives With: Alone Available Help at Discharge: Friend(s);Available PRN/intermittently Vocation: Full time employment   Cognition  Overall Cognitive Status: Impaired/Different from baseline Arousal/Alertness: Awake/alert Orientation Level: Oriented to person;Disoriented to place;Disoriented to time;Disoriented to situation Attention: Focused Focused Attention: Impaired Focused Attention Impairment: Verbal basic;Functional basic Memory:  (UTA) Awareness: Impaired Awareness Impairment: Emergent impairment Problem Solving: Impaired Problem Solving Impairment: Verbal basic;Functional basic Executive Function: Initiating Initiating: Impaired Initiating Impairment: Verbal basic;Functional basic (delay 15 seconds) Safety/Judgment: Impaired    Comprehension  Auditory Comprehension Overall Auditory Comprehension: Impaired Yes/No Questions: Impaired Basic Biographical Questions: 26-50% accurate Basic Immediate Environment Questions: 25-49% accurate Commands: Impaired One Step Basic Commands: 25-49% accurate (improved in spanish, improved with visual/gestural cues) Conversation: Simple Interfering Components: Attention;Processing speed Visual Recognition/Discrimination Discrimination: Not tested    Expression Verbal Expression Overall Verbal Expression: Impaired Initiation: Impaired Automatic Speech: Name;Social Response Level of Generative/Spontaneous Verbalization: Word;Phrase Repetition: Impaired Level of Impairment: Word level Naming: Impairment Pragmatics: Impairment Impairments: Eye contact (loses attention) Interfering Components:  Attention Non-Verbal Means of Communication: Gestures Written Expression Dominant Hand: Right Written Expression: Not tested   Oral / Motor Oral Motor/Sensory Function Overall Oral Motor/Sensory Function: Appears within functional limits for tasks assessed Motor  Speech Overall Motor Speech: Appears within functional limits for tasks assessed   GO    Harlon Ditty, MA CCC-SLP [phone]  Claudine Mouton 07/23/2013, 12:36 PM

## 2013-07-23 NOTE — Evaluation (Signed)
Clinical/Bedside Swallow Evaluation Patient Details  Name: Jaydence Arnesen MRN: [id number] Date of Birth: 02-14-1961  Today's Date: 07/23/2013 Time: 4098-1191 SLP Time Calculation (min): 30 min  Past Medical History:  Past Medical History  Diagnosis Date  . Testicular cancer     s/p resection  . Tobacco abuse   . Alcohol abuse   . Trauma     left arm (shoulder and elbow) s/p surgery   Past Surgical History:  Past Surgical History  Procedure Laterality Date  . Elbow surgery Left     July 2014  . Orchiectomy     HPI:  52 yo M with acute loss of speech and right sided weakness. MD suspects acute stroke, but CT was negative. MRI pending.    Assessment / Plan / Recommendation Clinical Impression  Pt demonstrated adequate tolerance of thin liquids without evidence of aspiration. Pt did require assitance with self feeding and also slow mastication of regular solids. Given absence of dysarthria/oral weakness and any signs of aspiration, recommend pt initiate a dys 3 (mechanical soft) diet with thin liquids and full supervision.     Aspiration Risk  Mild    Diet Recommendation Dysphagia 3 (Mechanical Soft);Thin liquid   Liquid Administration via: Cup;Straw Medication Administration: Whole meds with liquid Supervision: Full supervision/cueing for compensatory strategies    Other  Recommendations Oral Care Recommendations: Oral care BID   Follow Up Recommendations  Inpatient Rehab    Frequency and Duration min 2x/week      Pertinent Vitals/Pain NA    SLP Swallow Goals Patient will utilize recommended strategies during swallow to increase swallowing safety with: Supervision/safety Swallow Study Goal #2 - Progress: Progressing toward goal   Swallow Study Prior Functional Status       General HPI: 52 yo M with acute loss of speech and right sided weakness. MD suspects acute stroke, but CT was negative. MRI pending.  Type of Study: Bedside swallow evaluation Diet Prior to  this Study: Thin liquids;Dysphagia 3 (soft) Temperature Spikes Noted: No Respiratory Status: Room air History of Recent Intubation: No Behavior/Cognition: Alert;Cooperative;Confused Oral Cavity - Dentition: Adequate natural dentition Self-Feeding Abilities: Able to feed self;Needs assist Patient Positioning: Upright in bed Baseline Vocal Quality: Clear Volitional Cough: Strong Volitional Swallow: Able to elicit    Oral/Motor/Sensory Function Overall Oral Motor/Sensory Function: Appears within functional limits for tasks assessed   Ice Chips Ice chips: Not tested   Thin Liquid Thin Liquid: Within functional limits    Nectar Thick Nectar Thick Liquid: Not tested   Honey Thick Honey Thick Liquid: Not tested   Puree Puree: Within functional limits   Solid   GO    Solid: Impaired Presentation: Self Fed Oral Phase Functional Implications:  (slow mastication)      Harlon Ditty, MA CCC-SLP [phone]  Kashauna Celmer, Riley Nearing 07/23/2013,11:13 AM

## 2013-07-23 NOTE — Progress Notes (Signed)
OT / PT Cancellation Note  Patient Details Name: Brandon Alvarez MRN: [id number] DOB: July 12, 1961   Cancelled Treatment:    Reason Eval/Treat Not Completed: Patient at procedure or test/ unavailable (echo) OT/Pt to reattempt when appropriate.   Harrel Carina [place] Pager: [phone]  07/23/2013, 12:02 PM

## 2013-07-24 MED ORDER — PANTOPRAZOLE SODIUM 40 MG PO TBEC
40.0000 mg | DELAYED_RELEASE_TABLET | Freq: Every day | ORAL | Status: DC
  Administered 2013-07-24 – 2013-07-25 (×2): 40 mg via ORAL
  Filled 2013-07-24 (×2): qty 1

## 2013-07-24 MED ORDER — ASPIRIN EC 81 MG PO TBEC
81.0000 mg | DELAYED_RELEASE_TABLET | Freq: Every day | ORAL | Status: DC
  Administered 2013-07-24 – 2013-07-25 (×2): 81 mg via ORAL
  Filled 2013-07-24 (×2): qty 1

## 2013-07-24 MED ORDER — ASPIRIN 325 MG PO TABS: 325.0000 mg | ORAL_TABLET | Freq: Every day | ORAL | Status: DC

## 2013-07-24 MED ORDER — SIMVASTATIN 40 MG PO TABS
40.0000 mg | ORAL_TABLET | Freq: Every day | ORAL | Status: DC
  Administered 2013-07-24: 40 mg via ORAL
  Filled 2013-07-24 (×2): qty 1

## 2013-07-24 MED ORDER — SIMVASTATIN 20 MG PO TABS
20.0000 mg | ORAL_TABLET | Freq: Every day | ORAL | Status: DC
  Filled 2013-07-24: qty 1

## 2013-07-24 NOTE — Evaluation (Signed)
Physical Therapy Evaluation Patient Details Name: Brandon Alvarez MRN: [id number] DOB: 22-Dec-1960 Today's Date: 07/24/2013 Time: 5784-6962 PT Time Calculation (min): 40 min  PT Assessment / Plan / Recommendation History of Present Illness  52 yo M with acute loss of speech and right sided weakness. MD suspects acute stroke, but CT was negative. MRI negative. Pt positive for opiates on arrival.  Clinical Impression  Pt admitted with R sided weakness and aphasia.  Pt currently with functional limitations due to the deficits listed below (see PT Problem List). His aphasia have mostly resolved.   Pt will benefit from skilled PT to increase their independence and safety with mobility to allow discharge to the venue listed below.       PT Assessment  Patient needs continued PT services    Follow Up Recommendations  CIR    Does the patient have the potential to tolerate intense rehabilitation      Barriers to Discharge        Equipment Recommendations  Other (comment) (TBA post acute)    Recommendations for Other Services Rehab consult   Frequency Min 4X/week    Precautions / Restrictions Precautions Precautions: Fall   Pertinent Vitals/Pain       Mobility  Bed Mobility Bed Mobility: Rolling Right;Right Sidelying to Sit;Sitting - Scoot to Edge of Bed Rolling Right: 4: Min assist Right Sidelying to Sit: 4: Min assist;HOB flat Sitting - Scoot to Edge of Bed: 4: Min assist Details for Bed Mobility Assistance: Cues for sequence and hand placement. Pt would require mod- max (A) without therapist education and assistance to exit on the right side Transfers Transfers: Sit to Stand;Stand to Sit;Squat Pivot Transfers Sit to Stand: 2: Max assist;With upper extremity assist;From bed Stand to Sit: With upper extremity assist;To chair/3-in-1;3: Mod assist Squat Pivot Transfers: 3: Mod assist;With upper extremity assistance Details for Transfer Assistance: cues for hand placement and right le  blocked to prevent buckling Ambulation/Gait Ambulation/Gait Assistance: Not tested (comment) Modified Rankin (Stroke Patients Only) Pre-Morbid Rankin Score: No symptoms Modified Rankin: Severe disability    Exercises     PT Diagnosis: Difficulty walking;Hemiplegia dominant side  PT Problem List: Decreased strength;Decreased balance;Decreased mobility;Decreased coordination;Decreased knowledge of use of DME;Impaired sensation PT Treatment Interventions: DME instruction;Gait training;Functional mobility training;Therapeutic activities;Balance training;Neuromuscular re-education;Patient/family education     PT Goals(Current goals can be found in the care plan section) Acute Rehab PT Goals Patient Stated Goal: to return to being able to use Rt side again PT Goal Formulation: With patient Time For Goal Achievement: 08/07/13 Potential to Achieve Goals: Good  Visit Information  Last PT Received On: 07/24/13 Assistance Needed: +2 History of Present Illness: 52 yo M with acute loss of speech and right sided weakness. MD suspects acute stroke, but CT was negative. MRI negative. Pt positive for opiates on arrival.       Prior Functioning  Home Living Family/patient expects to be discharged to:: Private residence Living Arrangements: Alone Available Help at Discharge: Friend(s);Available PRN/intermittently (son coming to stay until not needed) Type of Home: House Home Access: Stairs to enter [employer] of Steps: 4 Entrance Stairs-Rails: Can reach both Home Layout: One level Home Equipment: Grab bars - tub/shower;Grab bars - toilet;Shower seat - built in  Lives With: Alone (has dogs) Prior Function Level of Independence: Independent Comments: Out of work ~ 1 year due to accident at work Musician: No difficulties (speaking in english to therapist) Dominant Hand: Right    Cognition  Cognition Arousal/Alertness: Awake/alert  Behavior During Therapy: WFL  for tasks assessed/performed Overall Cognitive Status: Within Functional Limits for tasks assessed    Extremity/Trunk Assessment Upper Extremity Assessment Upper Extremity Assessment: RUE deficits/detail RUE Deficits / Details: 3- out 5, demonstrates [facility] PROM for wrist, hand, elbow shoulder. Pt needs support at elbow for elbow flexion/ extension 3 out 5.  RUE Sensation: decreased light touch RUE Coordination: decreased fine motor;decreased gross motor Lower Extremity Assessment Lower Extremity Assessment: RLE deficits/detail RLE Deficits / Details: hip flexors 2+/5, quad's 2+, hams 2, df/pf  2/5 RLE Sensation: decreased light touch RLE Coordination: decreased gross motor Cervical / Trunk Assessment Cervical / Trunk Assessment: Normal   Balance Balance Balance Assessed: Yes Static Sitting Balance Static Sitting - Balance Support: No upper extremity supported Static Sitting - Level of Assistance: 5: Stand by assistance Static Sitting - Comment/# of Minutes: 5 min; with cuing, pt  correct posture into more normal upright midline positioning Static Standing Balance Static Standing - Balance Support: Bilateral upper extremity supported;During functional activity Static Standing - Level of Assistance: 1: +2 Total assist (60 %) Static Standing - Comment/# of Minutes: ~10 minutes and demonstrates fatigue in Rt LE with incr buckle., but able to extend knee when not in WB/  End of Session PT - End of Session Activity Tolerance: Patient tolerated treatment well Patient left: in chair;with family/visitor present;with call bell/phone within reach Nurse Communication: Mobility status  GP     Sheretta Grumbine, Eliseo Gum 07/24/2013, 12:51 PM 07/24/2013  [employer] Bing, PT [phone]  (pager)

## 2013-07-24 NOTE — Progress Notes (Signed)
Notified Dr. Glendell Docker regarding a different regimen for patient's statin medication. MD aware and will modify order to best fit patient's need. Will continue to monitor.   Arthor Captain, RN

## 2013-07-24 NOTE — Care Management Note (Addendum)
    Page 1 of 2   07/27/2013     11:12:12 AM   CARE MANAGEMENT NOTE 07/27/2013  Patient:  Brandon Alvarez, Brandon Alvarez   Account Number:  [id number]  Date Initiated:  07/24/2013  Documentation initiated by:  Elmer Bales  Subjective/Objective Assessment:   Patient admitted with right sided weakness     Action/Plan:   Will follow for discharge needs.   Anticipated DC Date:  07/25/2013   Anticipated DC Plan:  HOME W HOME HEALTH SERVICES  In-house referral  Financial Counselor      DC Planning Services  CM consult      PAC Choice  DURABLE MEDICAL EQUIPMENT  HOME HEALTH   Choice offered to / List presented to:  C-2 HC POA / Guardian   DME arranged  WALKER - ROLLING  3-N-1  WHEELCHAIR - MANUAL      DME agency  [facility]     HH arranged  HH-1 RN  HH-2 PT  HH-3 OT  HH-4 NURSE'S AIDE  HH-5 SPEECH THERAPY      HH agency  [facility]   Status of service:  Completed, signed off Medicare Important Message given?   (If response is "NO", the following Medicare IM given date fields will be blank) Date Medicare IM given:   Date Additional Medicare IM given:    Discharge Disposition:  HOME W HOME HEALTH SERVICES  Per UR Regulation:  Reviewed for med. necessity/level of care/duration of stay  If discussed at Long Length of Stay Meetings, dates discussed:    Comments:  07/27/13 1100  Elmer Bales RN, MSN, CM- Verified with Debbie at [facility] that patient is set up for [facility] RN, Dr Glendell Docker is aware that patient is not eligiable for other [facility] services requested based on diagnosis.   07/25/13 ATIKAHALLRNCM [phone] Received call from nursing states patient to discharge home today with home health instead of CIR. Spoke with caregiver, Mariane Masters who states she will be assisting patient at home. Offered choice to [facility] and chose [facility] for PT/OT/RN/ST/AIDE AND RW, 3 IN1, WHEELCHAIR. Informed nurse need orders for 3in1 and and rw. Spoke witih Chasity  with [facility] to make aware of referral and they are able to accept.. Faxed information with confirmation received. Also supplied Mariane Masters with [facility] Department of Social Services number as well as Engineer, manufacturing number with [employer]. Also made inpatient LCSW aware that HCPOA information is requested. Also caregiver states that patient can afford 4 dollar meds. Thererfore MATCH program not needed at this time. ATIKAHALLRNCM [phone]     07/24/13 1500 Elmer Bales RN, MSN, CM-  Spoke with Dr Radene Journey regarding CM consult for medication needs.  CM noted that patient has been started on Zocor and requested that it be changed to Lovastatin as it is on the $4 medication list.  Dr is agreeable to this change and does not anticipate any additional discharge medication issues. Patient's RN was notified of plans to changed medication order.  Patient was notified.  Patient denies any additional needs and states that he follows up with Dr Ethelene Hal for pain management.  His PCP is through the [facility].

## 2013-07-24 NOTE — Progress Notes (Signed)
  Date: 07/24/2013  Patient name: Saiquan Hands  Medical record number: [id number]  Date of birth: [birth year]   This patient has been seen and the plan of care was discussed with the house staff. Please see their note for complete details. I concur with their findings with the following additions/corrections:  No evidence of CVA.  His symptoms are non-organic. I am concerned about a conversion disorder in this gentleman.  There is also a concern for underlying depression.  His neurological deficits are not consistent today and he has no facial droop and no definite focal weakness.  Discussed ASA for primary prevention, he can stay on this and discuss with his PCP whether he can continue this. He has a new dx of Type 2 DM, he will need to F/U with his PCP to discuss choices of treatment and diabetes education. He has evidence of PVD by CT on carotid arteries with ~50% stenosis. He is a candidate for statin therapy, moderate to high intensity. Discussed this with him and he agrees to therapy. He can be d/c home. He will need outpatient follow up in 1-2 weeks with his PCP.  Jonah Blue, DO 07/24/2013, 2:32 PM

## 2013-07-24 NOTE — Progress Notes (Signed)
Subjective:  NAE overnight. The patient has had significant improvement overnight. He is able to speak, and his confusion has abated. He has begun to be able to move his right hand, arm, toes, and leg.  Objective: Vital signs in last 24 hours: Filed Vitals:   07/24/13 0200 07/24/13 0527 07/24/13 0926 07/24/13 1408  BP: 130/90 128/80 121/67 114/69  Pulse: 75 73 76 93  Temp:  98.1 F (36.7 C) 98 F (36.7 C) 98.6 F (37 C)  TempSrc:  Oral Oral Oral  Resp:  18 18 16   Height:      Weight:      SpO2:  98% 97% 100%   Weight change:   Intake/Output Summary (Last 24 hours) at 07/24/13 1527 Last data filed at 07/24/13 1230  Gross per 24 hour  Intake    480 ml  Output    500 ml  Net    -20 ml  Physical Exam  Constitutional: He appears well-developed and well-nourished. No distress.  HENT:  Head: Normocephalic and atraumatic.  Mouth/Throat: Oropharynx is clear and moist. No oropharyngeal exudate.  Eyes: Conjunctivae and EOM are normal. No scleral icterus.  Cardiovascular: Normal rate, regular rhythm, normal heart sounds and intact distal pulses. Exam reveals no friction rub.  No murmur heard.  Pulmonary/Chest: Effort normal and breath sounds normal. No respiratory distress. He has no wheezes. He has no rales.  Neurological: He is alert.  See neurology consult for complete neurologic exam. Mild muscl weakness in patients right arm, hand and leg. Aand Ox3 Skin: He is not diaphoretic.    Lab Results: Basic Metabolic Panel:  Recent Labs Lab 07/22/13 1043 07/23/13 0930  NA 138 139  K 3.5 3.5  CL 100 103  CO2 25 25  GLUCOSE 213* 171*  BUN 7 9  CREATININE 0.64 0.66  CALCIUM 9.6 9.5   Liver Function Tests:  Recent Labs Lab 07/22/13 0955 07/22/13 1043  AST 32 35  ALT 67* 66*  ALKPHOS 85 84  BILITOT 0.3 0.3  PROT 7.1 7.2  ALBUMIN 4.1 4.2    Recent Labs Lab 07/22/13 0955 07/23/13 0930  WBC 8.3 8.5  NEUTROABS 4.4  --   HGB 13.9 14.7  HCT 39.6 41.9  MCV 93.6  93.1  PLT 151 160   Cardiac Enzymes:  Recent Labs Lab 07/22/13 0955  TROPONINI <0.30  Fasting Lipid Panel:  Recent Labs Lab 07/23/13 0500  CHOL 214*  HDL 33*  LDLCALC 105*  TRIG 380*  CHOLHDL 6.5   Thyroid Function Tests:  Recent Labs Lab 07/22/13 1805  TSH 1.777   Coagulation:  Recent Labs Lab 07/22/13 0955  LABPROT 12.6  INR 0.96   Urine Drug Screen: Drugs of Abuse     Component Value Date/Time   LABOPIA POSITIVE* 07/22/2013 1501   COCAINSCRNUR NONE DETECTED 07/22/2013 1501   LABBENZ NONE DETECTED 07/22/2013 1501   AMPHETMU NONE DETECTED 07/22/2013 1501   THCU NONE DETECTED 07/22/2013 1501   LABBARB NONE DETECTED 07/22/2013 1501    Studies/Results: Dg Chest 2 View  07/22/2013   *RADIOLOGY REPORT*  Clinical Data: Stroke  CHEST - 2 VIEW  Comparison: Prior chest x-ray 08/01/2007  Findings: Very low inspiratory volumes with crowding of the pulmonary vasculature.  Interstitial prominence is exaggerated compared to prior.  Cardiac and mediastinal contours remain within normal limits.  No pneumothorax or pleural effusion.  IMPRESSION: Very low inspiratory volumes with bibasilar atelectasis and crowding of the pulmonary vasculature.   Original Report Authenticated By:  Malachy Moan, M.D.   Mr Brain Wo Contrast  07/24/2013   *RADIOLOGY REPORT*  Clinical Data: Patient with right-sided weakness, difficulty with speech.  MRI HEAD WITHOUT CONTRAST  Technique:  Multiplanar, multiecho pulse sequences of the brain and surrounding structures were obtained according to standard protocol without intravenous contrast.  Comparison: CTA performed on 07/23/03/2014 as well as prior head CTs.  Findings: There is no acute intracranial hemorrhage or infarct.  No diffusion weighted signal abnormalities identified.  No mass lesion.  No midline shift.  Multiple scattered prominent foci of hyperintense T2 / FLAIR signal are seen within the periventricular, deep, and subcortical white matter, most  consistent with chronic small vessel ischemic changes. There is mild diffuse atrophy.  The orbits and optic nerves are normal.  Midline structures are intact and normal in appearance. The craniocervical junction is normal.  No extra-axial fluid collection.  The paranasal sinuses are clear.  There is no mastoid effusion. The entering ear structures are grossly normal.  IMPRESSION: 1. No MRI evidence of acute intracranial infarct.  2. Mild atrophy with moderate leukoariosis.   Original Report Authenticated By: Rise Mu, M.D.   Medications: I have reviewed the patient's current medications. Scheduled Meds: . aspirin EC  81 mg Oral Daily  . nicotine  21 mg Transdermal Daily  . simvastatin  40 mg Oral q1800   Continuous Infusions:  PRN Meds:.acetaminophen, acetaminophen, LORazepam, LORazepam, senna-docusate Assessment/Plan: Principal Problem:   Right sided weakness   Assessment  Plan   Right Sided Weakness with Aphasia  The patients sx's are consistent with a left MCA CVA. He was considered outside the treatment window. Seizures are unlikely. Non-hemorrhagic as CT negative. However, all of the imaging studies indicate no stroke. It is possible that the patient had a severe migraine, but unlikely. Conversion disorder also remains a possibility. HgbA1c 7.9, fasting lipid panel LDL 105, TSH Normal  - MRI negative for CVA, appreciate neuro's rec's  - Echocardiogram normal - Start Statin - Continue baby Aspirin - Risk factor modification  - Telemetry monitoring  - PT consult, OT consult - Rec f/u with PCP regarding new onset DM II.  Left Arm Pain Likely 2/2 to recent surgery - tylenol prn  Alcohol Abuse  Patient consumes about 18 oz of alcohol/ week  - CIWA protocol   Tobacco Abuse  - Nicotine patch   DVT/Nutrition  SCDs(Plan to switch to heparin after stroek w/u)/NPO       Dispo: Disposition is deferred at this time, awaiting improvement of current medical problems.   Anticipated discharge in approximately 2-4 day(s).   The patient does not have a current PCP (No Pcp Per Patient) and does need an [facility] hospital follow-up appointment after discharge.  The patient does have transportation limitations that hinder transportation to clinic appointments.  .Services Needed at time of discharge: Y = Yes, Blank = No PT:   OT:   RN:   Equipment:   Other:     LOS: 2 days   Pleas Koch, MD 07/24/2013, 3:27 PM

## 2013-07-24 NOTE — Progress Notes (Signed)
Speech Language Pathology Treatment Patient Details Name: Ersel Enslin MRN: [id number] DOB: 08/14/1961 Today's Date: 07/24/2013 Time: 1150-1230 SLP Time Calculation (min): 40 min  SLP Goals  SLP Goals Potential to Achieve Goals: Good SLP Goal #1 - Progress: Progressing toward goal SLP Goal #2 - Progress: Met  General Temperature Spikes Noted: No Respiratory Status: Room air Behavior/Cognition: Alert;Cooperative;Pleasant mood;Confused Oral Cavity - Dentition: Adequate natural dentition Patient Positioning: Other (comment) (sitting at edge of bed)  Oral Cavity - Oral Hygiene     Treatment Treatment focused on: Cognition;Other (comment) (dysphagia (diet toleration)) Skilled Treatment: Treatment session focused on skilled toleration of current diet (Dys 3, thin liquids) as well as cognitive-linguistic tx. Patient able to feed self with minimal setup assistance for opening containers. No over s/s aspiration noted during lunch meal. Patient appears to be improving with his cognitive-linguisitic function, as compared to SLP's evaluation notes from 07/23/13. Patient was able to participate in question-response and conversation-level expression, able to identify and describe his deficits, and was oriented to place and situation, approximate time. He stated that "It feels like my arm and leg are waking up from being asleep", referring to the tingling sensation and some return of function that he has noticed. Patient expressed concerns that "I am a health person, why did this happen?" Patient appears to be improving with cognitive function, but remains with confusion and mild perseveration of thoughts vs depressed mood.         Elio Forget Tarrell 07/24/2013, 4:35 PM  Angela Nevin, MA, CCC-SLP [facility] Speech-Language Pathologist

## 2013-07-24 NOTE — Progress Notes (Signed)
See my separate note.

## 2013-07-24 NOTE — Evaluation (Signed)
Occupational Therapy Evaluation Patient Details Name: Brandon Alvarez MRN: [id number] DOB: 1961-12-11 Today's Date: 07/24/2013 Time:  -     OT Assessment / Plan / Recommendation History of present illness 52 yo M with acute loss of speech and right sided weakness. MD suspects acute stroke, but CT was negative. MRI negative. Pt positive for opiates on arrival.   Clinical Impression   PT admitted with Rt side weakness and aphasia. Pt currently with functional limitiations due to the deficits listed below (see OT problem list).  Pt will benefit from skilled OT to increase their independence and safety with adls and balance to allow discharge CIR.     OT Assessment  Patient needs continued OT Services    Follow Up Recommendations  CIR    Barriers to Discharge      Equipment Recommendations  3 in 1 bedside comode;Other (comment) (RW)    Recommendations for Other Services Rehab consult  Frequency  Min 3X/week    Precautions / Restrictions Precautions Precautions: Fall   Pertinent Vitals/Pain No pain Tingling in Rt LE and UE    ADL  Eating/Feeding: Minimal assistance Where Assessed - Eating/Feeding: Chair Grooming: Brushing hair;Denture care;Moderate assistance Where Assessed - Grooming: Supported sitting Where Assessed - Upper Body Dressing: Supported sitting Lower Body Dressing: Maximal assistance Where Assessed - Lower Body Dressing: Supported sitting Toilet Transfer: Moderate assistance Toilet Transfer Method: Squat pivot Toilet Transfer Equipment: Raised toilet seat with arms (or 3-in-1 over toilet) (squat pivot performed due to Rt Le weakness.) Transfers/Ambulation Related to ADLs: Pt completed bed mobility and sit<>stand at eOB. Pt with Rt LE weakness and buckling with static standing. Pt demonstrates active muscule recruitement during static standing weight shifting. Pt with bil Ue on the back of a chair for support. Pt's right hand too weak on eval to hold RW. The height of  the recliner back promotes an upright posture. ADL Comments: Pt supine on arrival and much more alert. Pt verbalizes no remembering anything since 07/22/13 morning hours. Pt very concerned he states "I was yelling but it was like no one could hear me"    OT Diagnosis: Generalized weakness;Disturbance of vision;Hemiplegia dominant side  OT Problem List: Decreased strength;Decreased activity tolerance;Impaired balance (sitting and/or standing);Decreased safety awareness;Decreased knowledge of use of DME or AE;Decreased knowledge of precautions;Impaired UE functional use;Impaired sensation OT Treatment Interventions: Self-care/ADL training;Therapeutic exercise;Neuromuscular education;DME and/or AE instruction;Therapeutic activities;Visual/perceptual remediation/compensation;Patient/family education;Balance training   OT Goals(Current goals can be found in the care plan section) Acute Rehab OT Goals Patient Stated Goal: to return to being able to use Rt side again OT Goal Formulation: With patient Time For Goal Achievement: 08/07/13 Potential to Achieve Goals: Good ADL Goals Pt Will Perform Eating: with set-up;with adaptive utensils Pt Will Perform Grooming: with mod assist;standing Pt Will Perform Upper Body Bathing: with min assist;sitting Pt Will Transfer to Toilet: with max assist;bedside commode  Visit Information  Last OT Received On: 07/24/13 Assistance Needed: +2 PT/OT Co-Evaluation/Treatment: Yes History of Present Illness: 52 yo M with acute loss of speech and right sided weakness. MD suspects acute stroke, but CT was negative. MRI negative. Pt positive for opiates on arrival.       Prior Functioning     Home Living Family/patient expects to be discharged to:: Private residence Living Arrangements: Alone Available Help at Discharge: Friend(s);Available PRN/intermittently (son coming to stay until not needed) Type of Home: House Home Access: Stairs to enter [employer] of Steps: 4 Entrance Stairs-Rails: Can reach both Home  Layout: One level Home Equipment: Grab bars - tub/shower;Grab bars - toilet;Shower seat - built in  Lives With: Alone (has dogs) Prior Function Level of Independence: Independent Comments: Out of work ~ 1 year due to accident at work Musician: No difficulties (speaking in english to therapist) Dominant Hand: Right         Vision/Perception Vision - History Baseline Vision: Wears glasses all the time Patient Visual Report: No change from baseline Vision - Assessment Eye Alignment: Within Functional Limits Vision Assessment: Vision tested Ocular Range of Motion: Restricted looking up;Impaired-to be further tested in functional context Tracking/Visual Pursuits: Decreased smoothness of eye movement to RIGHT superior field;Decreased smoothness of eye movement to LEFT superior field;Requires cues, head turns, or add eye shifts to track;Decreased smoothness of horizontal tracking Convergence: Impaired - to be further tested in functional context Visual Fields: Other (comment) (Rt and left superior quadrant deficits) Diplopia Assessment: Other (comment) (reports it as a "smear" as if the object fans out)   Cognition  Cognition Arousal/Alertness: Awake/alert Behavior During Therapy: WFL for tasks assessed/performed Overall Cognitive Status: Within Functional Limits for tasks assessed    Extremity/Trunk Assessment Upper Extremity Assessment RUE Deficits / Details: 3- out 5, demonstrates [facility] PROM for wrist, hand, elbow shoulder. Pt needs support at elbow for elbow flexion/ extension 3 out 5.  RUE Sensation: decreased light touch RUE Coordination: decreased fine motor;decreased gross motor Lower Extremity Assessment Lower Extremity Assessment: RLE deficits/detail RLE Deficits / Details: hip flexors 2+/5, quad's 2+, hams 2, df/pf  2/5 RLE Sensation: decreased light touch RLE Coordination: decreased  gross motor Cervical / Trunk Assessment Cervical / Trunk Assessment: Normal     Mobility Bed Mobility Bed Mobility: Rolling Right;Right Sidelying to Sit;Sitting - Scoot to Edge of Bed Rolling Right: 4: Min assist Right Sidelying to Sit: 4: Min assist;HOB flat Sitting - Scoot to Edge of Bed: 4: Min assist Details for Bed Mobility Assistance: Cues for sequence and hand placement. Pt would require mod- max (A) without therapist education and assistance to exit on the right side Transfers Transfers: Sit to Stand;Stand to Sit Sit to Stand: 2: Max assist;With upper extremity assist;From bed Stand to Sit: With upper extremity assist;To chair/3-in-1;3: Mod assist Details for Transfer Assistance: cues for hand placement and right le blocked to prevent buckling     Exercise     Balance Balance Balance Assessed: Yes Static Sitting Balance Static Sitting - Balance Support: No upper extremity supported Static Sitting - Level of Assistance: 5: Stand by assistance Static Sitting - Comment/# of Minutes: 5 min; with cuing, pt  correct posture into more normal upright midline positioning Static Standing Balance Static Standing - Balance Support: Bilateral upper extremity supported;During functional activity Static Standing - Level of Assistance: 1: +2 Total assist (60 %) Static Standing - Comment/# of Minutes: ~10 minutes and demonstrates fatigue in Rt LE with incr buckle., but able to extend knee when not in WB/   End of Session OT - End of Session Activity Tolerance: Patient tolerated treatment well Patient left: in chair;with call bell/phone within reach;with family/visitor present Nurse Communication: Mobility status;Precautions  GO     Lucile Shutters 07/24/2013, 2:21 PM Pager: [phone]

## 2013-07-24 NOTE — Progress Notes (Signed)
Stroke Team Progress Note  HISTORY Brandon Alvarez is an 52 y.o. male who presented to the ED 07/22/2013 at 0904 with rght sided weakness. History is not able to be obtained by patient and was obtained by his driver and EMS. Per driver, he stated "he was not feeling well yesterday morning and all day yesterday". He mentioned a HA and she noted he was dragging his right leg this morning which was not normal for him. He was brought to his orthopedic appointment where he was noted to have speech difficulty and was quickly brought to [facility] . On exam he is mute but at one point said "Brandon Alvarez" multiple times. He follows no verbal commands and and shows weakness in his right arm and leg. Unable to determine last known well. Patient was not a TPA candidate secondary to unknown last known well. He was admitted for further evaluation and treatment.  SUBJECTIVE There is a lady at at the bedside.  Overall he feels his condition is gradually improving. He is asking about bringing his nephew who is a neurologist from [place] to take care of him.  OBJECTIVE Most recent Vital Signs: Filed Vitals:   07/24/13 0108 07/24/13 0200 07/24/13 0527 07/24/13 0926  BP: 133/80 130/90 128/80 121/67  Pulse: 73 75 73 76  Temp: 98.3 F (36.8 C)  98.1 F (36.7 C) 98 F (36.7 C)  TempSrc: Oral  Oral Oral  Resp: 18  18 18   Height:      Weight:      SpO2: 96%  98% 97%   CBG (last 3)  No results found for this basename: GLUCAP,  in the last 72 hours  IV Fluid Intake:     MEDICATIONS  . aspirin  300 mg Rectal Daily  . nicotine  21 mg Transdermal Daily   PRN:  acetaminophen, acetaminophen, LORazepam, LORazepam, senna-docusate  Diet:  Dysphagia 3 thin liquids Activity:  Bedrest DVT Prophylaxis:  SCDs  CLINICALLY SIGNIFICANT STUDIES Basic Metabolic Panel:  Recent Labs Lab 07/22/13 1043 07/23/13 0930  NA 138 139  K 3.5 3.5  CL 100 103  CO2 25 25  GLUCOSE 213* 171*  BUN 7 9  CREATININE 0.64 0.66  CALCIUM 9.6  9.5   Liver Function Tests:  Recent Labs Lab 07/22/13 0955 07/22/13 1043  AST 32 35  ALT 67* 66*  ALKPHOS 85 84  BILITOT 0.3 0.3  PROT 7.1 7.2  ALBUMIN 4.1 4.2   CBC:  Recent Labs Lab 07/22/13 0955 07/23/13 0930  WBC 8.3 8.5  NEUTROABS 4.4  --   HGB 13.9 14.7  HCT 39.6 41.9  MCV 93.6 93.1  PLT 151 160   Coagulation:  Recent Labs Lab 07/22/13 0955  LABPROT 12.6  INR 0.96   Cardiac Enzymes:  Recent Labs Lab 07/22/13 0955  TROPONINI <0.30   Urinalysis: No results found for this basename: COLORURINE, APPERANCEUR, LABSPEC, PHURINE, GLUCOSEU, HGBUR, BILIRUBINUR, KETONESUR, PROTEINUR, UROBILINOGEN, NITRITE, LEUKOCYTESUR,  in the last 168 hours Lipid Panel    Component Value Date/Time   CHOL 214* 07/23/2013 0500   TRIG 380* 07/23/2013 0500   HDL 33* 07/23/2013 0500   CHOLHDL 6.5 07/23/2013 0500   VLDL 76* 07/23/2013 0500   LDLCALC 105* 07/23/2013 0500   HgbA1C  Lab Results  Component Value Date   HGBA1C 7.9* 07/23/2013    Urine Drug Screen:     Component Value Date/Time   LABOPIA POSITIVE* 07/22/2013 1501   COCAINSCRNUR NONE DETECTED 07/22/2013 1501   LABBENZ NONE  DETECTED 07/22/2013 1501   AMPHETMU NONE DETECTED 07/22/2013 1501   THCU NONE DETECTED 07/22/2013 1501   LABBARB NONE DETECTED 07/22/2013 1501    Alcohol Level: No results found for this basename: ETH,  in the last 168 hours  CT of the brain  07/22/2013 1. No acute intracranial abnormalities. 2. Chronic microvascular ischemic changes in the cerebral white matter.  CT Angio Head  07/22/2013  Calcified plaque cavernous segment of the internal carotid artery with mild to moderate narrowing slightly greater on the left.  Minimal irregularity of the M1 segment of the middle cerebral artery bilaterally without high-grade stenosis.  No significant asymmetry of number of visualized middle cerebral artery branch vessels.  No significant narrowing of the anterior cerebral artery on either side.  Right vertebral artery is dominant.   Minimal irregularity of the basilar artery without high-grade stenosis.  Minimal branch vessel irregularity of the posterior circulation.  Ct Angio Neck   07/22/2013    Right carotid bifurcation mild plaque with mild narrowing of the proximal right internal carotid artery (less than 50%).  Calcified focal plaque proximal left internal carotid artery with 54% diameter stenosis.  Mild narrowing proximal right vertebral artery.  Right vertebral artery is the dominant vertebral artery.  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   CT Cerebral Perfusion  07/22/2013    No findings of large acute infarct.  Present examination incorporates from the mid centrum semiovale to the posterior fossa. The superior aspect of the supratentorial region is not included on perfusion imaging.    MRI of the brain  07/24/2013  1. No MRI evidence of acute intracranial infarct.  2. Mild atrophy with moderate leukoariosis  MRA of the brain    2D Echocardiogram  See CT angio head  Carotid Doppler  See CT angio neck  CXR  07/22/2013   Very low inspiratory volumes with bibasilar atelectasis and crowding of the pulmonary vasculature.    EKG     Therapy Recommendations   Physical Exam   Pleasant middle aged hispanic male not in distress.Awake alert. Afebrile. Head is nontraumatic. Neck is supple without bruit. Hearing is normal. Cardiac exam no murmur or gallop. Lungs are clear to auscultation. Distal pulses are well felt. Neurological Exam ; awake alert oriented to time place and person. Speech and language appear normal. Fundi were not visualized. Vision acuity and fields appear normal. Face is symmetric but he has an asymmetrical smile with poor effort on the right. Tongue is midline. Motor system exam patient On the right side and has variable strength. Last 2 left hand and leg off the bed he is unable to do so but when he is distracted and I hold up his extremities he is able to  maintain tone. Good strength in the left side. No of this sensory loss on exam. Coordination is good his poor in the right. Plantar downgoing. Gait was not tested. ASSESSMENT Brandon Alvarez is a 52 y.o. male presenting with new onset right sided weakness. Imaging confirms no acute infarct. Dx: non organic right leg weakness and headache. No stroke/TIA diagnoses.  On no antiplatelets prior to admission. Now on aspirin suppository for secondary stroke prevention. Patient with resultant right leg weakness. No further stroke work up indicated.    Chronic headache due to neck issues/bulging discs  Tobacco abuse  etoh abuse  family hx stroke (maternal grandmother)  Hospital day # 2  TREATMENT/PLAN  No indication for continued aspirin  from neuro standpoint  Headache management No further stroke workup indicated. Ongoing risk factor control by Primary Care Physician Stroke Service will sign off. Please call should any needs arise. No neuro follow up indicated  Dr. Pearlean Brownie discussed diagnosis, prognosis,  treatment options and plan of care with patient, friend at the bedside and teaching service team.   Annie Main, MSN, RN, ANVP-BC, ANP-BC, GNP-BC [facility] Pager: [phone] 07/24/2013 10:52 AM  I have personally obtained a history, examined the patient, evaluated imaging results, and formulated the assessment and plan of care. I agree with the above. Delia Heady, MD

## 2013-07-24 NOTE — Progress Notes (Signed)
Rehab Admissions Coordinator Note:  Patient was screened by Trish Mage for appropriateness for an Inpatient Acute Rehab Consult.  At this time, we are recommending Inpatient Rehab consult.  Lelon Frohlich M 07/24/2013, 11:41 AM  I can be reached at [phone].

## 2013-07-24 NOTE — Discharge Summary (Signed)
Name: Brandon Alvarez MRN: [id number] DOB: [birth year] 52 y.o. PCP: No Pcp Per Patient  Date of Admission: 07/22/2013  9:04 AM Date of Discharge: 07/24/2013 Attending Physician: Jonah Blue, DO  Discharge Diagnosis: 1. Right sided weakness  Discharge Medications:   Medication List    ASK your doctor about these medications       B-complex with vitamin C tablet  Take 1 tablet by mouth daily.     HYDROcodone-acetaminophen 10-325 MG per tablet  Commonly known as:  NORCO  Take 1 tablet by mouth every 3 (three) hours as needed for pain.     meloxicam 15 MG tablet  Commonly known as:  MOBIC  Take 15 mg by mouth daily.     traZODone 50 MG tablet  Commonly known as:  DESYREL  Take 50 mg by mouth at bedtime.     Vitamin C 500 MG Caps  Take 500 mg by mouth daily.     vitamin E 400 UNIT capsule  Take 400 Units by mouth daily.        Disposition and follow-up:   [name] was discharged from [facility] in Fair condition.  At the hospital follow up visit please address:  1.  Right sided weakness  2.  Labs / imaging needed at time of follow-up: None  3.  Pending labs/ test needing follow-up: None  Follow-up Appointments:  Follow-up Information  Follow up with Brandon Severin, NP. (call to make appointment when discharged)  Contact information:  [address]  [place] [address]  [phone]  Follow up with [facility]. [facility]ALTH PT/OT/ST/RN/AIDE AND EQUIPMENT)  Contact information:  [phone]  Follow up with [facility] Department of Social Services. (call regarding medicaid application)  Contact information:  [phone]  Follow up with [facility] Financial Counseling Dept. (call regarding Medicaid application)  Contact information:  [phone]    Discharge Instructions:   Consultations:  Neurology  Procedures Performed:  Ct Angio Head W/cm &/or Wo Cm  07/22/2013   *RADIOLOGY REPORT*  Clinical Data:   Right-sided weakness.  CT ANGIOGRAPHY HEAD AND NECK  Technique:  Multidetector CT imaging of the head and neck was performed using the standard protocol during bolus administration of intravenous contrast.  Multiplanar CT image reconstructions including MIPs were obtained to evaluate the vascular anatomy. Carotid stenosis measurements (when applicable) are obtained utilizing NASCET criteria, using the distal internal carotid diameter as the denominator.  Contrast: OMNIPAQUE IOHEXOL 350 MG/ML SOLN  Comparison:  07/22/2013 and 08/01/2007 unenhanced head CT.  CTA NECK  Findings:  Right carotid artery:  No significant narrowing of the common carotid artery.  Right carotid bifurcation mild plaque with mild narrowing of the proximal right internal carotid artery (less than 50%).  Left carotid artery:  No significant stenosis of the left common carotid artery.  Calcified focal plaque proximal left internal carotid artery with 54% diameter stenosis.  Mild narrowing proximal right subclavian artery.  Mild narrowing proximal right vertebral artery.  Right vertebral artery is the dominant vertebral artery.  Left vertebral artery arises directly from the aortic arch.  Visualized lung apices are clear.  Right lobe of thyroid gland more prominent than the left without obvious mass although streak artifact limits evaluation.  Mild prominence palatine tonsils without obvious mass identified.  Scattered normal sized lymph nodes throughout the neck largest in the level II region.  Mild cervical spondylotic changes most prominent C6-7.  Caries.  Minimal mucosal thickening right maxillary  sinus peri   Review of the MIP images confirms the above findings.  IMPRESSION:  Right carotid bifurcation mild plaque with mild narrowing of the proximal right internal carotid artery (less than 50%).  Calcified focal plaque proximal left internal carotid artery with 54% diameter stenosis.  Mild narrowing proximal right vertebral artery.  Right  vertebral artery is the dominant vertebral artery.  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  CTA HEAD  Findings:  No obvious intracranial hemorrhage or enhancing lesion. No hydrocephalus.  No CT findings of large acute infarct.  Please see below.  Right vertebral artery is dominant.  Minimal irregularity of the basilar artery without high-grade stenosis.  Minimal branch vessel irregularity of the posterior circulation.  Calcified plaque cavernous segment of the internal carotid artery with mild to moderate narrowing slightly greater on the left.  Minimal irregularity of the M1 segment of the middle cerebral artery bilaterally without high-grade stenosis.  No significant asymmetry of number of visualized middle cerebral artery branch vessels.  No significant narrowing of the anterior cerebral artery on either side.  No aneurysm or vascular malformation noted.   Review of the MIP images confirms the above findings.  IMPRESSION: Calcified plaque cavernous segment of the internal carotid artery with mild to moderate narrowing slightly greater on the left.  Minimal irregularity of the M1 segment of the middle cerebral artery bilaterally without high-grade stenosis.  No significant asymmetry of number of visualized middle cerebral artery branch vessels.  No significant narrowing of the anterior cerebral artery on either side.  Right vertebral artery is dominant.  Minimal irregularity of the basilar artery without high-grade stenosis.  Minimal branch vessel irregularity of the posterior circulation.  CT perfusion:  CT perfusion imaging was performed with computer-generated maps.  Findings:  No findings of large acute infarct.  Present examination incorporates from the mid centrum semiovale to the posterior fossa. The superior aspect of the supratentorial region is not included.  Impression:  No findings of large acute infarct.  Present examination incorporates from the mid  centrum semiovale to the posterior fossa. The superior aspect of the supratentorial region is not included on perfusion imaging.  Critical Value/emergent results were called by telephone at the time of interpretation on 07/22/2013 at 12:54 p.m. to Dr. Amada Jupiter., who verbally acknowledged these results.   Original Report Authenticated By: Lacy Duverney, M.D.   Dg Chest 2 View  07/22/2013   *RADIOLOGY REPORT*  Clinical Data: Stroke  CHEST - 2 VIEW  Comparison: Prior chest x-ray 08/01/2007  Findings: Very low inspiratory volumes with crowding of the pulmonary vasculature.  Interstitial prominence is exaggerated compared to prior.  Cardiac and mediastinal contours remain within normal limits.  No pneumothorax or pleural effusion.  IMPRESSION: Very low inspiratory volumes with bibasilar atelectasis and crowding of the pulmonary vasculature.   Original Report Authenticated By: Malachy Moan, M.D.   Ct Head Wo Contrast  07/22/2013   *RADIOLOGY REPORT*  Clinical Data: Headache.  Extremity weakness.  CT HEAD WITHOUT CONTRAST  Technique:  Contiguous axial images were obtained from the base of the skull through the vertex without contrast.  Comparison: Head CT 08/01/2007.  Findings: There are patchy and confluent areas of decreased attenuation throughout the deep and periventricular white matter of the cerebral hemispheres bilaterally, most compatible with chronic microvascular ischemic disease. No acute intracranial abnormalities.  Specifically, no evidence of acute intracranial hemorrhage, no definite findings of acute/subacute cerebral ischemia, no mass, mass effect, hydrocephalus or  abnormal intra or extra-axial fluid collections.  Visualized paranasal sinuses and mastoids are well pneumatized.  No acute displaced skull fractures are identified.  IMPRESSION: 1.  No acute intracranial abnormalities. 2.  Chronic microvascular ischemic changes in the cerebral white matter, as above.   Original Report Authenticated  By: Trudie Reed, M.D.   Ct Angio Neck W/cm &/or Wo/cm  07/22/2013   *RADIOLOGY REPORT*  Clinical Data:  Right-sided weakness.  CT ANGIOGRAPHY HEAD AND NECK  Technique:  Multidetector CT imaging of the head and neck was performed using the standard protocol during bolus administration of intravenous contrast.  Multiplanar CT image reconstructions including MIPs were obtained to evaluate the vascular anatomy. Carotid stenosis measurements (when applicable) are obtained utilizing NASCET criteria, using the distal internal carotid diameter as the denominator.  Contrast: OMNIPAQUE IOHEXOL 350 MG/ML SOLN  Comparison:  07/22/2013 and 08/01/2007 unenhanced head CT.  CTA NECK  Findings:  Right carotid artery:  No significant narrowing of the common carotid artery.  Right carotid bifurcation mild plaque with mild narrowing of the proximal right internal carotid artery (less than 50%).  Left carotid artery:  No significant stenosis of the left common carotid artery.  Calcified focal plaque proximal left internal carotid artery with 54% diameter stenosis.  Mild narrowing proximal right subclavian artery.  Mild narrowing proximal right vertebral artery.  Right vertebral artery is the dominant vertebral artery.  Left vertebral artery arises directly from the aortic arch.  Visualized lung apices are clear.  Right lobe of thyroid gland more prominent than the left without obvious mass although streak artifact limits evaluation.  Mild prominence palatine tonsils without obvious mass identified.  Scattered normal sized lymph nodes throughout the neck largest in the level II region.  Mild cervical spondylotic changes most prominent C6-7.  Caries.  Minimal mucosal thickening right maxillary sinus peri   Review of the MIP images confirms the above findings.  IMPRESSION:  Right carotid bifurcation mild plaque with mild narrowing of the proximal right internal carotid artery (less than 50%).  Calcified focal plaque proximal  left internal carotid artery with 54% diameter stenosis.  Mild narrowing proximal right vertebral artery.  Right vertebral artery is the dominant vertebral artery.  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  CTA HEAD  Findings:  No obvious intracranial hemorrhage or enhancing lesion. No hydrocephalus.  No CT findings of large acute infarct.  Please see below.  Right vertebral artery is dominant.  Minimal irregularity of the basilar artery without high-grade stenosis.  Minimal branch vessel irregularity of the posterior circulation.  Calcified plaque cavernous segment of the internal carotid artery with mild to moderate narrowing slightly greater on the left.  Minimal irregularity of the M1 segment of the middle cerebral artery bilaterally without high-grade stenosis.  No significant asymmetry of number of visualized middle cerebral artery branch vessels.  No significant narrowing of the anterior cerebral artery on either side.  No aneurysm or vascular malformation noted.   Review of the MIP images confirms the above findings.  IMPRESSION: Calcified plaque cavernous segment of the internal carotid artery with mild to moderate narrowing slightly greater on the left.  Minimal irregularity of the M1 segment of the middle cerebral artery bilaterally without high-grade stenosis.  No significant asymmetry of number of visualized middle cerebral artery branch vessels.  No significant narrowing of the anterior cerebral artery on either side.  Right vertebral artery is dominant.  Minimal irregularity of the basilar artery without  high-grade stenosis.  Minimal branch vessel irregularity of the posterior circulation.  CT perfusion:  CT perfusion imaging was performed with computer-generated maps.  Findings:  No findings of large acute infarct.  Present examination incorporates from the mid centrum semiovale to the posterior fossa. The superior aspect of the supratentorial region is  not included.  Impression:  No findings of large acute infarct.  Present examination incorporates from the mid centrum semiovale to the posterior fossa. The superior aspect of the supratentorial region is not included on perfusion imaging.  Critical Value/emergent results were called by telephone at the time of interpretation on 07/22/2013 at 12:54 p.m. to Dr. Amada Jupiter., who verbally acknowledged these results.   Original Report Authenticated By: Lacy Duverney, M.D.   Mr Brain Wo Contrast  07/24/2013   *RADIOLOGY REPORT*  Clinical Data: Patient with right-sided weakness, difficulty with speech.  MRI HEAD WITHOUT CONTRAST  Technique:  Multiplanar, multiecho pulse sequences of the brain and surrounding structures were obtained according to standard protocol without intravenous contrast.  Comparison: CTA performed on 07/23/03/2014 as well as prior head CTs.  Findings: There is no acute intracranial hemorrhage or infarct.  No diffusion weighted signal abnormalities identified.  No mass lesion.  No midline shift.  Multiple scattered prominent foci of hyperintense T2 / FLAIR signal are seen within the periventricular, deep, and subcortical white matter, most consistent with chronic small vessel ischemic changes. There is mild diffuse atrophy.  The orbits and optic nerves are normal.  Midline structures are intact and normal in appearance. The craniocervical junction is normal.  No extra-axial fluid collection.  The paranasal sinuses are clear.  There is no mastoid effusion. The entering ear structures are grossly normal.  IMPRESSION: 1. No MRI evidence of acute intracranial infarct.  2. Mild atrophy with moderate leukoariosis.   Original Report Authenticated By: Rise Mu, M.D.   Ct Cerebral Perfusion W/cm  07/22/2013   *RADIOLOGY REPORT*  Clinical Data:  Right-sided weakness.  CT ANGIOGRAPHY HEAD AND NECK  Technique:  Multidetector CT imaging of the head and neck was performed using the standard protocol  during bolus administration of intravenous contrast.  Multiplanar CT image reconstructions including MIPs were obtained to evaluate the vascular anatomy. Carotid stenosis measurements (when applicable) are obtained utilizing NASCET criteria, using the distal internal carotid diameter as the denominator.  Contrast: OMNIPAQUE IOHEXOL 350 MG/ML SOLN  Comparison:  07/22/2013 and 08/01/2007 unenhanced head CT.  CTA NECK  Findings:  Right carotid artery:  No significant narrowing of the common carotid artery.  Right carotid bifurcation mild plaque with mild narrowing of the proximal right internal carotid artery (less than 50%).  Left carotid artery:  No significant stenosis of the left common carotid artery.  Calcified focal plaque proximal left internal carotid artery with 54% diameter stenosis.  Mild narrowing proximal right subclavian artery.  Mild narrowing proximal right vertebral artery.  Right vertebral artery is the dominant vertebral artery.  Left vertebral artery arises directly from the aortic arch.  Visualized lung apices are clear.  Right lobe of thyroid gland more prominent than the left without obvious mass although streak artifact limits evaluation.  Mild prominence palatine tonsils without obvious mass identified.  Scattered normal sized lymph nodes throughout the neck largest in the level II region.  Mild cervical spondylotic changes most prominent C6-7.  Caries.  Minimal mucosal thickening right maxillary sinus peri   Review of the MIP images confirms the above findings.  IMPRESSION:  Right carotid bifurcation mild plaque with  mild narrowing of the proximal right internal carotid artery (less than 50%).  Calcified focal plaque proximal left internal carotid artery with 54% diameter stenosis.  Mild narrowing proximal right vertebral artery.  Right vertebral artery is the dominant vertebral artery.  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  CTA HEAD   Findings:  No obvious intracranial hemorrhage or enhancing lesion. No hydrocephalus.  No CT findings of large acute infarct.  Please see below.  Right vertebral artery is dominant.  Minimal irregularity of the basilar artery without high-grade stenosis.  Minimal branch vessel irregularity of the posterior circulation.  Calcified plaque cavernous segment of the internal carotid artery with mild to moderate narrowing slightly greater on the left.  Minimal irregularity of the M1 segment of the middle cerebral artery bilaterally without high-grade stenosis.  No significant asymmetry of number of visualized middle cerebral artery branch vessels.  No significant narrowing of the anterior cerebral artery on either side.  No aneurysm or vascular malformation noted.   Review of the MIP images confirms the above findings.  IMPRESSION: Calcified plaque cavernous segment of the internal carotid artery with mild to moderate narrowing slightly greater on the left.  Minimal irregularity of the M1 segment of the middle cerebral artery bilaterally without high-grade stenosis.  No significant asymmetry of number of visualized middle cerebral artery branch vessels.  No significant narrowing of the anterior cerebral artery on either side.  Right vertebral artery is dominant.  Minimal irregularity of the basilar artery without high-grade stenosis.  Minimal branch vessel irregularity of the posterior circulation.  CT perfusion:  CT perfusion imaging was performed with computer-generated maps.  Findings:  No findings of large acute infarct.  Present examination incorporates from the mid centrum semiovale to the posterior fossa. The superior aspect of the supratentorial region is not included.  Impression:  No findings of large acute infarct.  Present examination incorporates from the mid centrum semiovale to the posterior fossa. The superior aspect of the supratentorial region is not included on perfusion imaging.  Critical Value/emergent  results were called by telephone at the time of interpretation on 07/22/2013 at 12:54 p.m. to Dr. Amada Jupiter., who verbally acknowledged these results.   Original Report Authenticated By: Lacy Duverney, M.D.   Admission HPI: Adric Wrede is a 52 y.o. mane with a pmhx of tobacco abuse, alcohol abuse, and recent orthopedic surgery comes to the ED with a cc of right sided weakness and speech difficulty. As the patient is unable to speak, the majority of the history was taken from his ex-fiancee and current girlfriend. The patient was in his normal state of health until yesterday when he had 5-6 episodes of right sided numbness in his face and arm. These episodes lasted about twenty minutes and then completely resolved. He had no other symptoms during this times including h/a, n/v, f/c, or focal weakness. This morning the patient was able to go to his appointment with is orthopaedic surgeon. At this visit, the patient was found to be mute and had profound right-sided weakness. EMS was called to the clinic and brought him to the ED.   Hospital Course by problem list:   1. Right Sided Weakness- The patient presented with right sided weakness and an inability to speak. These symptoms had nearly completely resolved by the time of discharge without medical intervention. The patients sx's were consistent with a left MCA CVA, but no hemorrhage was seen on CT and MRI indicated no ischemia or  infarct.  Furthermore CTA failed to demonstrate a vascular abnormality that could explain his symptoms. Thus there was no evidence of CVA. It is possible that the patient had a severe migraine, but this seems unlikely given the duration of the weakness. Conversion disorder also remains a possibility as his neurologic exam was inconsistent throughout the hospitalization. As the pateint as concerned of spinal stenosis, an MRI of the c-spine was performed which demonstrated mild degenerative changes at C6-7. We recommended CIR, but the  patient refused and elected to be discharged home.   Discharge Vitals:   BP 114/69  Pulse 93  Temp(Src) 98.6 F (37 C) (Oral)  Resp 16  Ht 5\' 9"  (1.753 m)  Wt 205 lb 7.5 oz (93.2 kg)  BMI 30.33 kg/m2  SpO2 100%  Discharge Labs:  No results found for this or any previous visit (from the past 24 hour(s)).  Signed: Pleas Koch, MD 07/24/2013, 5:16 PM   Time Spent on Discharge: 30 minutes Services Ordered on Discharge: None Equipment Ordered on Discharge: None

## 2013-07-25 ENCOUNTER — Inpatient Hospital Stay (HOSPITAL_COMMUNITY): Payer: 59

## 2013-07-25 MED ORDER — PANTOPRAZOLE SODIUM 40 MG PO TBEC: 40.0000 mg | DELAYED_RELEASE_TABLET | Freq: Every day | ORAL | Status: DC

## 2013-07-25 MED ORDER — ASPIRIN 81 MG PO TBEC: 81.0000 mg | DELAYED_RELEASE_TABLET | Freq: Every day | ORAL | Status: DC

## 2013-07-25 MED ORDER — LOVASTATIN 20 MG PO TABS: 20.0000 mg | ORAL_TABLET | Freq: Every day | ORAL | Status: DC

## 2013-07-25 MED ORDER — NICOTINE 21 MG/24HR TD PT24: 1.0000 | MEDICATED_PATCH | TRANSDERMAL | Status: DC

## 2013-07-25 NOTE — Progress Notes (Signed)
CSW received phone call from case manager about talking with the patient about healthcare power of attorney. CSW contacted spiritual services and spoke with chaplain about patient. Chaplain to review healthcare power of attorney with patient please contact CSW with any further needs.   Brandon Alvarez [phone]

## 2013-07-25 NOTE — Progress Notes (Signed)
Subjective: Patient feels ok. Still c/o right sided weakness. Aphagia is resolving and he is able to speak and communicate well.  No acute events overnight.  Objective: Vital signs in last 24 hours: Filed Vitals:   07/24/13 1807 07/24/13 2126 07/25/13 0009 07/25/13 0500  BP: 128/82 126/78 126/75 131/83  Pulse: 80 80 78 76  Temp: 98.7 F (37.1 C) 98 F (36.7 C) 98.4 F (36.9 C) 98.2 F (36.8 C)  TempSrc: Oral Oral Oral Oral  Resp: 16 16 16 16   Height:      Weight:      SpO2: 100% 100% 100% 100%   Weight change:   Intake/Output Summary (Last 24 hours) at 07/25/13 0820 Last data filed at 07/24/13 1742  Gross per 24 hour  Intake    480 ml  Output      0 ml  Net    480 ml   General: NAD Neck: supple. Non tenderness on C spine. Lungs: CTA B/L Heart: RRR. No M/G/R Abd: soft, BS x 4 LE: no edema Neuro" A+O x 3, right sided weakness 2-3/5. However, he has "give-in" weakness when repeating his neuro exam ( his weakness is not consistent between exams). Sensation in tact.   Lab Results: Basic Metabolic Panel:  Recent Labs Lab 07/22/13 1043 07/23/13 0930  Halia Franey 138 139  K 3.5 3.5  CL 100 103  CO2 25 25  GLUCOSE 213* 171*  BUN 7 9  CREATININE 0.64 0.66  CALCIUM 9.6 9.5   Liver Function Tests:  Recent Labs Lab 07/22/13 0955 07/22/13 1043  AST 32 35  ALT 67* 66*  ALKPHOS 85 84  BILITOT 0.3 0.3  PROT 7.1 7.2  ALBUMIN 4.1 4.2   CBC:  Recent Labs Lab 07/22/13 0955 07/23/13 0930  WBC 8.3 8.5  NEUTROABS 4.4  --   HGB 13.9 14.7  HCT 39.6 41.9  MCV 93.6 93.1  PLT 151 160   Cardiac Enzymes:  Recent Labs Lab 07/22/13 0955  TROPONINI <0.30   Hemoglobin A1C:  Recent Labs Lab 07/23/13 0500  HGBA1C 7.9*   Fasting Lipid Panel:  Recent Labs Lab 07/23/13 0500  CHOL 214*  HDL 33*  LDLCALC 105*  TRIG 380*  CHOLHDL 6.5   Thyroid Function Tests:  Recent Labs Lab 07/22/13 1805  TSH 1.777   Coagulation:  Recent Labs Lab 07/22/13 0955    LABPROT 12.6  INR 0.96   Anemia Panel: No results found for this basename: VITAMINB12, FOLATE, FERRITIN, TIBC, IRON, RETICCTPCT,  in the last 168 hours Urine Drug Screen: Drugs of Abuse     Component Value Date/Time   LABOPIA POSITIVE* 07/22/2013 1501   COCAINSCRNUR NONE DETECTED 07/22/2013 1501   LABBENZ NONE DETECTED 07/22/2013 1501   AMPHETMU NONE DETECTED 07/22/2013 1501   THCU NONE DETECTED 07/22/2013 1501   LABBARB NONE DETECTED 07/22/2013 1501    Studies/Results: Mr Brain Wo Contrast  07/24/2013   *RADIOLOGY REPORT*  Clinical Data: Patient with right-sided weakness, difficulty with speech.  MRI HEAD WITHOUT CONTRAST  Technique:  Multiplanar, multiecho pulse sequences of the brain and surrounding structures were obtained according to standard protocol without intravenous contrast.  Comparison: CTA performed on 07/23/03/2014 as well as prior head CTs.  Findings: There is no acute intracranial hemorrhage or infarct.  No diffusion weighted signal abnormalities identified.  No mass lesion.  No midline shift.  Multiple scattered prominent foci of hyperintense T2 / FLAIR signal are seen within the periventricular, deep, and subcortical white matter, most  consistent with chronic small vessel ischemic changes. There is mild diffuse atrophy.  The orbits and optic nerves are normal.  Midline structures are intact and normal in appearance. The craniocervical junction is normal.  No extra-axial fluid collection.  The paranasal sinuses are clear.  There is no mastoid effusion. The entering ear structures are grossly normal.  IMPRESSION: 1. No MRI evidence of acute intracranial infarct.  2. Mild atrophy with moderate leukoariosis.   Original Report Authenticated By: Rise Mu, M.D.   Medications: I have reviewed the patient's current medications. Scheduled Meds: . aspirin EC  81 mg Oral Daily  . nicotine  21 mg Transdermal Daily  . pantoprazole  40 mg Oral Daily  . simvastatin  40 mg Oral q1800    Continuous Infusions:  PRN Meds:.acetaminophen, acetaminophen, LORazepam, LORazepam, senna-docusate Assessment/Plan: # Right sided weakness and Aphasia- aphagia resolving.     No abnormality noted on imaging study. And his neuro exam is not consistent between separate exams. ? Conversion disorder remains possibility.   -Pt refused to go to CIR and would like to go home. - D/C home with [facility] PT/OT/RN/Aid and all equipment ordered. - ASA 81 QD - Mevacor 20 QHS( only $4 statin option. Patient states that he only wants $4 med).   # Newly onset DM A1C 7.9 - defer to his PCP for medical management and DM education.   # Alcohol abuse- education on cessation done #Tobacco abuse- stop smoking, nicotine patch # Depression Patient states that he is depressed but denies SI/HI - On Trazodone per home med. Encourage to talk to his PCP for management   Dispo: Disposition is deferred at this time, awaiting improvement of current medical problems.  Anticipated discharge in approximately today(s).   The patient does have a current PCP and does not need an [facility] hospital follow-up appointment after discharge.  The patient does not have transportation limitations that hinder transportation to clinic appointments.  .Services Needed at time of discharge: Y = Yes, Blank = No PT:   OT:   RN:   Equipment:   Other:     LOS: 3 days   Dede Query, MD 07/25/2013, 8:20 AM

## 2013-07-25 NOTE — Care Management Note (Signed)
   CARE MANAGEMENT NOTE 07/25/2013  Patient:  Brandon Alvarez, Brandon Alvarez   Account Number:  [id number]  Date Initiated:  07/24/2013  Documentation initiated by:  Elmer Bales  Subjective/Objective Assessment:   Patient admitted with right sided weakness     Action/Plan:   Will follow for discharge needs.   Anticipated DC Date:  07/25/2013   Anticipated DC Plan:  HOME W HOME HEALTH SERVICES  In-house referral  Financial Counselor      DC Planning Services  CM consult      PAC Choice  DURABLE MEDICAL EQUIPMENT  HOME HEALTH   Choice offered to / List presented to:  C-2 HC POA / Guardian   DME arranged  WALKER - ROLLING  3-N-1  WHEELCHAIR - MANUAL      DME agency  [facility]     HH arranged  HH-1 RN  HH-2 PT  HH-3 OT  HH-4 NURSE'S AIDE  HH-5 SPEECH THERAPY      HH agency  [facility]   Status of service:  Completed, signed off Medicare Important Message given?   (If response is "NO", the following Medicare IM given date fields will be blank) Date Medicare IM given:   Date Additional Medicare IM given:    Discharge Disposition:  HOME W HOME HEALTH SERVICES  Per UR Regulation:  Reviewed for med. necessity/level of care/duration of stay  If discussed at Long Length of Stay Meetings, dates discussed:    Comments:  07/25/13 ATIKAHALLRNCM [phone] Received call from nursing states patient to discharge home today with home health instead of CIR. Spoke with caregiver, Mariane Masters who states she will be assisting patient at home. Offered choice to [facility] and chose [facility] for PT/OT/RN/ST/AIDE AND RW, 3 IN1, WHEELCHAIR. Informed nurse need orders for 3in1 and and rw. Spoke witih Chasity with [facility] to make aware of referral and they are able to accept.. Faxed information with confirmation received. Also supplied Mariane Masters with [facility] Department of Social Services number as well as Engineer, manufacturing number with [employer].  Also made inpatient LCSW aware that HCPOA information is requested. Also caregiver states that patient can afford 4 dollar meds. Thererfore MATCH program not needed at this time. Confirmed PCP IS REGINA [name] WITH [facility]. ATIKAHALLRNCM [phone]     07/24/13 1500 Elmer Bales RN, MSN, CM-  Spoke with Dr Radene Journey regarding CM consult for medication needs.  CM noted that patient has been started on Zocor and requested that it be changed to Lovastatin as it is on the $4 medication list.  Dr is agreeable to this change and does not anticipate any additional discharge medication issues. Patient's RN was notified of plans to changed medication order.  Patient was notified.  Patient denies any additional needs and states that he follows up with Dr Ethelene Hal for pain management.  His PCP is through the [facility].

## 2013-07-25 NOTE — Progress Notes (Signed)
Physical Therapy Treatment Patient Details Name: Brandon Alvarez MRN: [id number] DOB: 10-11-1961 Today's Date: 07/25/2013 Time: 4098-1191 PT Time Calculation (min): 42 min  PT Assessment / Plan / Recommendation  History of Present Illness 52 yo M with acute loss of speech and right sided weakness. MD suspects acute stroke, but CT was negative. MRI negative. Pt positive for opiates on arrival.   PT Comments   Completed education with pt and "family" that was necessary to get him home as safely as possible.  Pt preferred to receive his therapy at home instead of OPPT.   Follow Up Recommendations  Home health PT;Supervision for mobility/OOB     Does the patient have the potential to tolerate intense rehabilitation     Barriers to Discharge        Equipment Recommendations  Rolling walker with 5" wheels;3in1 (PT);Wheelchair (measurements PT);Wheelchair cushion (measurements PT) (18 by 18 w/c and cushion, std removable Legrests and desk ar)    Recommendations for Other Services    Frequency Min 4X/week   Progress towards PT Goals Progress towards PT goals: Progressing toward goals  Plan Discharge plan needs to be updated    Precautions / Restrictions Precautions Precautions: Fall Restrictions Weight Bearing Restrictions: No   Pertinent Vitals/Pain     Mobility  Bed Mobility Bed Mobility: Supine to Sit;Sitting - Scoot to Edge of Bed Rolling Left: 5: Supervision Left Sidelying to Sit: 4: Min guard;HOB flat Supine to Sit: 4: Min guard Sitting - Scoot to [place] of Bed: 4: Min guard Sit to Sidelying Left: 5: Supervision Details for Bed Mobility Assistance: cues for sequencing and technique Transfers Transfers: Sit to Stand;Stand to Sit;Squat Pivot Transfers Sit to Stand: 4: Min assist Stand to Sit: 4: Min assist Squat Pivot Transfers: 4: Min guard Details for Transfer Assistance: vc's for hand placement and safest technique Ambulation/Gait Ambulation/Gait Assistance: 4: Min  assist Ambulation Distance (Feet): 15 Feet Assistive device: Rolling walker Ambulation/Gait Assistance Details: HP gait R with slow, uncoordinated swing through Gait Pattern: Step-to pattern Gait velocity: slow Stairs: Yes Stairs Assistance: 4: Min assist Stairs Assistance Details (indicate cue type and reason): heavy use of rail, step to pattern Stair Management Technique: One rail Left;Step to pattern;Sideways;Forwards Number of Stairs: 6 (pt's ex girlfriend assisted) Modified Rankin (Stroke Patients Only) Pre-Morbid Rankin Score: No symptoms Modified Rankin: Moderately severe disability    Exercises     PT Diagnosis:    PT Problem List:   PT Treatment Interventions:     PT Goals (current goals can now be found in the care plan section) Acute Rehab PT Goals PT Goal Formulation: With patient Time For Goal Achievement: 08/07/13 Potential to Achieve Goals: Good  Visit Information  Last PT Received On: 07/25/13 Assistance Needed: +2 History of Present Illness: 52 yo M with acute loss of speech and right sided weakness. MD suspects acute stroke, but CT was negative. MRI negative. Pt positive for opiates on arrival.    Subjective Data      Cognition  Cognition Arousal/Alertness: Awake/alert Behavior During Therapy: WFL for tasks assessed/performed;Flat affect Overall Cognitive Status: Within Functional Limits for tasks assessed    Balance  Static Sitting Balance Static Sitting - Balance Support: No upper extremity supported Static Sitting - Level of Assistance: 5: Stand by assistance Static Sitting - Comment/# of Minutes: approx. 15 min. for completion of lb dressing tasks and sim. self feeding Static Standing Balance Static Standing - Balance Support: Bilateral upper extremity supported;During functional activity Static Standing - Level  of Assistance: 4: Min assist  End of Session PT - End of Session Activity Tolerance: Patient tolerated treatment well Patient left: in  chair;with family/visitor present;with call bell/phone within reach Nurse Communication: Mobility status   GP     Aurie Harroun, Eliseo Gum 07/25/2013, 11:52 AM 07/25/2013  [place] Bing, PT [phone]  (pager)

## 2013-07-25 NOTE — Progress Notes (Signed)
Chaplain Note: pt sitting up in bed with 2 visitors at bedside.  Provided Advance Directive education and assisted pt in completing both HCPOA and Living Will.  Worked with Warden/ranger to try & locate a Conservation officer, nature but Consulting civil engineer at [employer].  Pt advised to take form to bank to be notarized since he is being discharged today.  Rutherford Nail Chaplain [phone]

## 2013-07-25 NOTE — Progress Notes (Signed)
Occupational Therapy Treatment Patient Details Name: Brandon Alvarez MRN: [id number] DOB: December 27, 1960 Today's Date: 07/25/2013 Time: 4098-1191 OT Time Calculation (min): 28 min  OT Assessment / Plan / Recommendation       OT comments  Pt. Able to sit un supported approx. 15 min. For lb dressing tasks and sim. Self feeding with con't. Use and incorporation of r ue.  Note rec. For CIR, and agree.  Pts. Sister and girlfriend present and state d/c home today, if so pt. Will benefit from con't. Therapy out pt. Vs. hh  to increase function with ot/pt/and st  Follow Up Recommendations  CIR           Equipment Recommendations  3 in 1 bedside comode    Recommendations for Other Services Rehab consult  Frequency Min 3X/week   Progress towards OT Goals Progress towards OT goals: Progressing toward goals  Plan Other (comment) (sister and girlfriend state pt. to d/c home today?)    Precautions / Restrictions Precautions Precautions: Fall   Pertinent Vitals/Pain No c/o pain, denies    ADL  Eating/Feeding: Simulated;Min guard Where Assessed - Eating/Feeding: Edge of bed Lower Body Dressing: Performed;Min guard Where Assessed - Lower Body Dressing: Unsupported sitting ADL Comments: sim. self feeding holding item to replicate fork/spoon. pt. able to simulate scooping movement and bring to mouth with light elbow support to bring fully to mouth.  donned/doffed b socks seated un supported eob.  able to cross l/r s, r/l with b hand supporting le from pt. to bring over left knee.  sister and girlfriend present.  asking questions about "how do we get him stronger".  reviewed allowing pt. to try any task with b ues prior to them assisting. also reviewe integrating b hands/extremities to all tasks that he is able to promote function         OT Goals(current goals can now be found in the care plan section)    Visit Information  Last OT Received On: 07/25/13                 Cognition   Cognition Arousal/Alertness: Awake/alert Behavior During Therapy: [facility] for tasks assessed/performed Overall Cognitive Status: Within Functional Limits for tasks assessed    Mobility  Bed Mobility Bed Mobility: Rolling Left;Left Sidelying to Sit;Sitting - Scoot to Edge of Bed;Sit to Sidelying Left Rolling Left: 5: Supervision Left Sidelying to Sit: 4: Min guard;HOB flat Sitting - Scoot to Edge of Bed: 4: Min guard Sit to Sidelying Left: 5: Supervision Details for Bed Mobility Assistance: cues for sequencing and tech.    Exercises      Balance Static Sitting Balance Static Sitting - Balance Support: No upper extremity supported Static Sitting - Level of Assistance: 5: Stand by assistance Static Sitting - Comment/# of Minutes: approx. 15 min. for completion of lb dressing tasks and sim. self feeding   End of Session OT - End of Session Activity Tolerance: Patient tolerated treatment well Patient left: in bed;with call bell/phone within reach;with family/visitor present       Robet Leu, COTA/L 07/25/2013, 10:17 AM

## 2013-07-30 NOTE — Discharge Summary (Signed)
  Date: 07/30/2013  Patient name: Brandon Alvarez  Medical record number: [id number]  Date of birth: 1961-01-28   This patient has been discussed with the house staff. Please see their note for complete details. I concur with their findings and plan.  Jonah Blue, DO 07/30/2013, 12:55 PM

## 2014-01-18 ENCOUNTER — Emergency Department (HOSPITAL_COMMUNITY): Payer: 59

## 2014-01-18 ENCOUNTER — Encounter (HOSPITAL_COMMUNITY): Payer: Self-pay | Admitting: Emergency Medicine

## 2014-01-18 ENCOUNTER — Emergency Department (HOSPITAL_COMMUNITY)
Admission: EM | Admit: 2014-01-18 | Discharge: 2014-01-18 | Disposition: A | Discharge status: Home / Self Care | Payer: 59 | Attending: Emergency Medicine | Admitting: Emergency Medicine

## 2014-01-18 DIAGNOSIS — Z79899 Other long term (current) drug therapy: Secondary | ICD-10-CM | POA: Insufficient documentation

## 2014-01-18 DIAGNOSIS — R059 Cough, unspecified: Secondary | ICD-10-CM | POA: Insufficient documentation

## 2014-01-18 DIAGNOSIS — Z8547 Personal history of malignant neoplasm of testis: Secondary | ICD-10-CM | POA: Insufficient documentation

## 2014-01-18 DIAGNOSIS — R0789 Other chest pain: Secondary | ICD-10-CM | POA: Insufficient documentation

## 2014-01-18 DIAGNOSIS — F17200 Nicotine dependence, unspecified, uncomplicated: Secondary | ICD-10-CM | POA: Insufficient documentation

## 2014-01-18 DIAGNOSIS — Z7982 Long term (current) use of aspirin: Secondary | ICD-10-CM | POA: Insufficient documentation

## 2014-01-18 DIAGNOSIS — R51 Headache: Secondary | ICD-10-CM | POA: Insufficient documentation

## 2014-01-18 DIAGNOSIS — F1021 Alcohol dependence, in remission: Secondary | ICD-10-CM | POA: Insufficient documentation

## 2014-01-18 DIAGNOSIS — R05 Cough: Secondary | ICD-10-CM | POA: Insufficient documentation

## 2014-01-18 DIAGNOSIS — Z8673 Personal history of transient ischemic attack (TIA), and cerebral infarction without residual deficits: Secondary | ICD-10-CM | POA: Insufficient documentation

## 2014-01-18 DIAGNOSIS — Z87828 Personal history of other (healed) physical injury and trauma: Secondary | ICD-10-CM | POA: Insufficient documentation

## 2014-01-18 DIAGNOSIS — R519 Headache, unspecified: Secondary | ICD-10-CM

## 2014-01-18 DIAGNOSIS — H7290 Unspecified perforation of tympanic membrane, unspecified ear: Secondary | ICD-10-CM | POA: Insufficient documentation

## 2014-01-18 DIAGNOSIS — R079 Chest pain, unspecified: Secondary | ICD-10-CM

## 2014-01-18 LAB — COMPREHENSIVE METABOLIC PANEL
ALBUMIN: 4 g/dL (ref 3.5–5.2)
ALK PHOS: 112 U/L (ref 39–117)
ALT: 74 U/L — AB (ref 0–53)
AST: 31 U/L (ref 0–37)
BILIRUBIN TOTAL: 0.3 mg/dL (ref 0.3–1.2)
BUN: 10 mg/dL (ref 6–23)
CHLORIDE: 101 meq/L (ref 96–112)
CO2: 22 mEq/L (ref 19–32)
Calcium: 9.3 mg/dL (ref 8.4–10.5)
Creatinine, Ser: 0.72 mg/dL (ref 0.50–1.35)
Glucose, Bld: 159 mg/dL — ABNORMAL HIGH (ref 70–99)
POTASSIUM: 3.9 meq/L (ref 3.7–5.3)
SODIUM: 141 meq/L (ref 137–147)
Total Protein: 7.3 g/dL (ref 6.0–8.3)

## 2014-01-18 LAB — CBC WITH DIFFERENTIAL/PLATELET
BASOS ABS: 0.1 10*3/uL (ref 0.0–0.1)
BASOS PCT: 1 % (ref 0–1)
Eosinophils Absolute: 0.2 10*3/uL (ref 0.0–0.7)
Eosinophils Relative: 3 % (ref 0–5)
HCT: 41.7 % (ref 39.0–52.0)
HEMOGLOBIN: 14.8 g/dL (ref 13.0–17.0)
Lymphocytes Relative: 41 % (ref 12–46)
Lymphs Abs: 3.3 10*3/uL (ref 0.7–4.0)
MCH: 33 pg (ref 26.0–34.0)
MCHC: 35.5 g/dL (ref 30.0–36.0)
MCV: 93.1 fL (ref 78.0–100.0)
MONOS PCT: 6 % (ref 3–12)
Monocytes Absolute: 0.5 10*3/uL (ref 0.1–1.0)
NEUTROS ABS: 4 10*3/uL (ref 1.7–7.7)
NEUTROS PCT: 50 % (ref 43–77)
Platelets: 162 10*3/uL (ref 150–400)
RBC: 4.48 MIL/uL (ref 4.22–5.81)
RDW: 12.8 % (ref 11.5–15.5)
WBC: 8.1 10*3/uL (ref 4.0–10.5)

## 2014-01-18 LAB — TROPONIN I

## 2014-01-18 MED ORDER — HYDROCODONE-ACETAMINOPHEN 5-325 MG PO TABS
2.0000 | ORAL_TABLET | Freq: Once | ORAL | Status: AC
  Administered 2014-01-18: 2 via ORAL
  Filled 2014-01-18: qty 2

## 2014-01-18 MED ORDER — KETOROLAC TROMETHAMINE 30 MG/ML IJ SOLN
30.0000 mg | Freq: Once | INTRAMUSCULAR | Status: AC
  Administered 2014-01-18: 30 mg via INTRAVENOUS
  Filled 2014-01-18: qty 1

## 2014-01-18 MED ORDER — HYDROCODONE-ACETAMINOPHEN 5-325 MG PO TABS: 2.0000 | ORAL_TABLET | ORAL | Status: DC | PRN

## 2014-01-18 MED ORDER — AZITHROMYCIN 250 MG PO TABS: ORAL_TABLET | ORAL | Status: DC

## 2014-01-18 NOTE — ED Notes (Addendum)
[facility] from doctor's office in [place] with c/o headache that started Friday. Woke up this am with blood draining from right ear. Has hx of CVA in August with resulting weakness on right side. Also c/o chest pain left sided started on Friday at same time. Intermittent, squeezing

## 2014-01-18 NOTE — ED Provider Notes (Signed)
CSN: [id number]     Arrival date & time 01/18/14  4098 History   First MD Initiated Contact with Patient 01/18/14 [phone]     Chief Complaint  Patient presents with  . Headache  . Chest Pain   (Consider location/radiation/quality/duration/timing/severity/associated sxs/prior Treatment) HPI Comments: Patient is a 53 year old male with past medical history of stroke in August of 2014. He was sent here from her primary doctor's office for evaluation of headache and intermittent chest discomfort for the past 3 days. He denies any nausea, shortness of breath, diaphoresis, radiation to the arm or jaw. He denies any visual changes, stiff neck, or fever. He did wake up this morning with blood coming from his right ear. He denies any injury or trauma to this area. He is concerned because his prior stroke started with similar symptoms.  Patient is a 53 y.o. male presenting with headaches and chest pain. The history is provided by the patient.  Headache Pain location:  Frontal Quality:  Dull Radiates to:  Does not radiate Onset quality:  Gradual Duration:  3 days Timing:  Intermittent Progression:  Worsening Chronicity:  New Context: activity and bright light   Relieved by:  Nothing Worsened by:  Activity Chest Pain Associated symptoms: headache     Past Medical History  Diagnosis Date  . Testicular cancer     s/p resection  . Tobacco abuse   . Alcohol abuse   . Trauma     left arm (shoulder and elbow) s/p surgery   Past Surgical History  Procedure Laterality Date  . Elbow surgery Left     July 2014  . Orchiectomy     Family History  Problem Relation Age of Onset  . Stroke Maternal Grandmother    History  Substance Use Topics  . Smoking status: Current Every Day Smoker -- 2.00 packs/day for 40 years    Types: Cigarettes  . Smokeless tobacco: Not on file  . Alcohol Use: 18.0 oz/week    30 Cans of beer per week    Review of Systems  Cardiovascular: Positive for chest pain.   Neurological: Positive for headaches.  All other systems reviewed and are negative.    Allergies  Review of patient's allergies indicates no known allergies.  Home Medications   Current Outpatient Rx  Name  Route  Sig  Dispense  Refill  . Ascorbic Acid (VITAMIN C) 500 MG CAPS   Oral   Take 500 mg by mouth daily.         Marland Kitchen aspirin EC 81 MG EC tablet   Oral   Take 1 tablet (81 mg total) by mouth daily.   30 tablet   1   . B Complex-C (B-COMPLEX WITH VITAMIN C) tablet   Oral   Take 1 tablet by mouth daily.         Marland Kitchen HYDROcodone-acetaminophen (NORCO) 10-325 MG per tablet   Oral   Take 1 tablet by mouth every 3 (three) hours as needed for pain.         Marland Kitchen lovastatin (MEVACOR) 20 MG tablet   Oral   Take 1 tablet (20 mg total) by mouth at bedtime.   30 tablet   0   . nicotine (NICODERM CQ - DOSED IN MG/24 HOURS) 21 mg/24hr patch   Transdermal   Place 1 patch onto the skin daily.   28 patch   0   . pantoprazole (PROTONIX) 40 MG tablet   Oral   Take 1 tablet (40  mg total) by mouth daily.   30 tablet   0   . traZODone (DESYREL) 50 MG tablet   Oral   Take 50 mg by mouth at bedtime.          Pulse 85  Temp(Src) 98 F (36.7 C) (Oral)  Resp 16  Wt 200 lb (90.719 kg)  SpO2 96% Physical Exam  Nursing note and vitals reviewed. Constitutional: He is oriented to person, place, and time. He appears well-developed and well-nourished. No distress.  HENT:  Head: Normocephalic and atraumatic.  Mouth/Throat: Oropharynx is clear and moist.  There is blood in the right ear canal and what appears to be a perforated tympanic membrane.  Eyes: EOM are normal. Pupils are equal, round, and reactive to light.  Neck: Normal range of motion. Neck supple.  Cardiovascular: Normal rate, regular rhythm and normal heart sounds.   No murmur heard. Pulmonary/Chest: Effort normal and breath sounds normal. No respiratory distress. He has no wheezes.  Abdominal: Soft. Bowel sounds  are normal. He exhibits no distension. There is no tenderness.  Musculoskeletal: Normal range of motion. He exhibits no edema.  Lymphadenopathy:    He has no cervical adenopathy.  Neurological: He is alert and oriented to person, place, and time. No cranial nerve deficit. He exhibits normal muscle tone. Coordination normal.  Skin: Skin is warm and dry. He is not diaphoretic.    ED Course  Procedures (including critical care time) Labs Review Labs Reviewed  CBC WITH DIFFERENTIAL  COMPREHENSIVE METABOLIC PANEL  TROPONIN I   Imaging Review No results found.   Date: 01/18/2014  Rate: 85  Rhythm: normal sinus rhythm  QRS Axis: left  Intervals: normal  ST/T Wave abnormalities: normal  Conduction Disutrbances:none  Narrative Interpretation:   Old EKG Reviewed: none available    MDM  No diagnosis found. Patient is a 53 year old male with history of CVA in August. He presents with complaints of headache and chest discomfort. He has no prior cardiac history. Is also complaining of cough and noticed that he had blood from his right ear this morning. It appears as though he has a perforated tympanic membrane which I will treat with an antibiotic and followup. His CT scan does not reveal any acute abnormalities. There is no bleeding or recurrent CVA noted. His chest x-ray does not reveal an infiltrate or any other acute process. At this point I feel as though he is stable for discharge.    Geoffery Lyonsouglas Reshanda Lewey, MD 01/18/14 [phone]

## 2014-01-18 NOTE — Discharge Instructions (Signed)
Zithromax as prescribed.  Hydrocodone as prescribed as needed for pain.  Return to the ER if you develop worsening headache, chest pain, or other new or concerning symptoms   Chest Pain (Nonspecific) It is often hard to give a specific diagnosis for the cause of chest pain. There is always a chance that your pain could be related to something serious, such as a heart attack or a blood clot in the lungs. You need to follow up with your caregiver for further evaluation. CAUSES   Heartburn.  Pneumonia or bronchitis.  Anxiety or stress.  Inflammation around your heart (pericarditis) or lung (pleuritis or pleurisy).  A blood clot in the lung.  A collapsed lung (pneumothorax). It can develop suddenly on its own (spontaneous pneumothorax) or from injury (trauma) to the chest.  Shingles infection (herpes zoster virus). The chest wall is composed of bones, muscles, and cartilage. Any of these can be the source of the pain.  The bones can be bruised by injury.  The muscles or cartilage can be strained by coughing or overwork.  The cartilage can be affected by inflammation and become sore (costochondritis). DIAGNOSIS  Lab tests or other studies, such as X-rays, electrocardiography, stress testing, or cardiac imaging, may be needed to find the cause of your pain.  TREATMENT   Treatment depends on what may be causing your chest pain. Treatment may include:  Acid blockers for heartburn.  Anti-inflammatory medicine.  Pain medicine for inflammatory conditions.  Antibiotics if an infection is present.  You may be advised to change lifestyle habits. This includes stopping smoking and avoiding alcohol, caffeine, and chocolate.  You may be advised to keep your head raised (elevated) when sleeping. This reduces the chance of acid going backward from your stomach into your esophagus.  Most of the time, nonspecific chest pain will improve within 2 to 3 days with rest and mild pain  medicine. HOME CARE INSTRUCTIONS   If antibiotics were prescribed, take your antibiotics as directed. Finish them even if you start to feel better.  For the next few days, avoid physical activities that bring on chest pain. Continue physical activities as directed.  Do not smoke.  Avoid drinking alcohol.  Only take over-the-counter or prescription medicine for pain, discomfort, or fever as directed by your caregiver.  Follow your caregiver's suggestions for further testing if your chest pain does not go away.  Keep any follow-up appointments you made. If you do not go to an appointment, you could develop lasting (chronic) problems with pain. If there is any problem keeping an appointment, you must call to reschedule. SEEK MEDICAL CARE IF:   You think you are having problems from the medicine you are taking. Read your medicine instructions carefully.  Your chest pain does not go away, even after treatment.  You develop a rash with blisters on your chest. SEEK IMMEDIATE MEDICAL CARE IF:   You have increased chest pain or pain that spreads to your arm, neck, jaw, back, or abdomen.  You develop shortness of breath, an increasing cough, or you are coughing up blood.  You have severe back or abdominal pain, feel nauseous, or vomit.  You develop severe weakness, fainting, or chills.  You have a fever. THIS IS AN EMERGENCY. Do not wait to see if the pain will go away. Get medical help at once. Call your local emergency services (911 in [place] Do not drive yourself to the hospital. MAKE SURE YOU:   Understand these instructions.  Will watch  your condition.  Will get help right away if you are not doing well or get worse. Document Released: 09/12/2005 Document Revised: 02/25/2012 Document Reviewed: 07/08/2008 [facility] Patient Information 2014 [place]Care, [place].  Eardrum Perforation The eardrum is a thin, round tissue inside the ear. It allows you to hear. The eardrum can get torn  (perforated). Eardrums often heal on their own. There is often little or no long-term hearing loss. HOME CARE   Keep your ear dry while it heals. Do not swim, dive, or take showers until your doctor says it is okay.  Before you take a bath, put petroleum jelly all over a cotton ball. Put the cotton ball in your ear. This will keep water out.  Only take medicines as told by your doctor.  Blow your nose gently.  Continue normal activities after your eardrum heals. Your doctor will tell you when your eardrum has healed.  Talk to your doctor before flying on an airplane.  Keep all doctor visits as told. This is important. GET HELP RIGHT AWAY IF:   You have blood or yellowish-white fluid (pus) coming from your ear.  You feel off balance.  You feel dizzy, sick to your stomach (nauseous), or you throw up (vomit).  You have more pain.  You have a fever. MAKE SURE YOU:   Understand these instructions.  Will watch your condition.  Will get help right away if you are not doing well or get worse. Document Released: 05/23/2010 Document Revised: 02/25/2012 Document Reviewed: 05/23/2010 [facility] Patient Information 2014 [place]Care, MarylandLL[place].

## 2014-03-16 ENCOUNTER — Emergency Department (HOSPITAL_COMMUNITY): Payer: 59

## 2014-03-16 ENCOUNTER — Encounter (HOSPITAL_COMMUNITY): Payer: Self-pay | Admitting: Emergency Medicine

## 2014-03-16 ENCOUNTER — Inpatient Hospital Stay (HOSPITAL_COMMUNITY)
Admission: EM | Admit: 2014-03-16 | Discharge: 2014-03-19 | DRG: 378 | Disposition: A | Discharge status: Home / Self Care | Payer: 59 | Attending: Internal Medicine | Admitting: Internal Medicine

## 2014-03-16 DIAGNOSIS — F17200 Nicotine dependence, unspecified, uncomplicated: Secondary | ICD-10-CM

## 2014-03-16 DIAGNOSIS — K648 Other hemorrhoids: Secondary | ICD-10-CM | POA: Diagnosis present

## 2014-03-16 DIAGNOSIS — K921 Melena: Principal | ICD-10-CM

## 2014-03-16 DIAGNOSIS — R0602 Shortness of breath: Secondary | ICD-10-CM

## 2014-03-16 DIAGNOSIS — I10 Essential (primary) hypertension: Secondary | ICD-10-CM | POA: Diagnosis present

## 2014-03-16 DIAGNOSIS — R739 Hyperglycemia, unspecified: Secondary | ICD-10-CM | POA: Diagnosis present

## 2014-03-16 DIAGNOSIS — S7291XA Unspecified fracture of right femur, initial encounter for closed fracture: Secondary | ICD-10-CM | POA: Insufficient documentation

## 2014-03-16 DIAGNOSIS — R634 Abnormal weight loss: Secondary | ICD-10-CM | POA: Diagnosis present

## 2014-03-16 DIAGNOSIS — F1010 Alcohol abuse, uncomplicated: Secondary | ICD-10-CM | POA: Diagnosis present

## 2014-03-16 DIAGNOSIS — K4090 Unilateral inguinal hernia, without obstruction or gangrene, not specified as recurrent: Secondary | ICD-10-CM | POA: Diagnosis present

## 2014-03-16 DIAGNOSIS — K649 Unspecified hemorrhoids: Secondary | ICD-10-CM

## 2014-03-16 DIAGNOSIS — J441 Chronic obstructive pulmonary disease with (acute) exacerbation: Secondary | ICD-10-CM | POA: Diagnosis present

## 2014-03-16 DIAGNOSIS — E119 Type 2 diabetes mellitus without complications: Secondary | ICD-10-CM

## 2014-03-16 DIAGNOSIS — Z8547 Personal history of malignant neoplasm of testis: Secondary | ICD-10-CM

## 2014-03-16 DIAGNOSIS — M1990 Unspecified osteoarthritis, unspecified site: Secondary | ICD-10-CM | POA: Diagnosis present

## 2014-03-16 DIAGNOSIS — K922 Gastrointestinal hemorrhage, unspecified: Secondary | ICD-10-CM | POA: Diagnosis present

## 2014-03-16 DIAGNOSIS — R062 Wheezing: Secondary | ICD-10-CM

## 2014-03-16 HISTORY — DX: Type 2 diabetes mellitus without complications: E11.9

## 2014-03-16 LAB — URINALYSIS, ROUTINE W REFLEX MICROSCOPIC
GLUCOSE, UA: NEGATIVE mg/dL
HGB URINE DIPSTICK: NEGATIVE
KETONES UR: 15 mg/dL — AB
LEUKOCYTES UA: NEGATIVE
Nitrite: NEGATIVE
PH: 5 (ref 5.0–8.0)
PROTEIN: NEGATIVE mg/dL
Specific Gravity, Urine: 1.028 (ref 1.005–1.030)
Urobilinogen, UA: 1 mg/dL (ref 0.0–1.0)

## 2014-03-16 LAB — POC OCCULT BLOOD, ED: FECAL OCCULT BLD: POSITIVE — AB

## 2014-03-16 LAB — CBC
HCT: 39.3 % (ref 39.0–52.0)
HEMOGLOBIN: 13.9 g/dL (ref 13.0–17.0)
MCH: 33.6 pg (ref 26.0–34.0)
MCHC: 35.4 g/dL (ref 30.0–36.0)
MCV: 94.9 fL (ref 78.0–100.0)
PLATELETS: 191 10*3/uL (ref 150–400)
RBC: 4.14 MIL/uL — ABNORMAL LOW (ref 4.22–5.81)
RDW: 13.1 % (ref 11.5–15.5)
WBC: 9.8 10*3/uL (ref 4.0–10.5)

## 2014-03-16 LAB — COMPREHENSIVE METABOLIC PANEL
ALK PHOS: 84 U/L (ref 39–117)
ALT: 49 U/L (ref 0–53)
AST: 22 U/L (ref 0–37)
Albumin: 4 g/dL (ref 3.5–5.2)
BILIRUBIN TOTAL: 0.3 mg/dL (ref 0.3–1.2)
BUN: 8 mg/dL (ref 6–23)
CHLORIDE: 103 meq/L (ref 96–112)
CO2: 24 mEq/L (ref 19–32)
Calcium: 9.3 mg/dL (ref 8.4–10.5)
Creatinine, Ser: 0.8 mg/dL (ref 0.50–1.35)
GFR calc Af Amer: >90 mL/min (ref >90)
GFR calc non Af Amer: >90 mL/min (ref >90)
Glucose, Bld: 159 mg/dL — ABNORMAL HIGH (ref 70–99)
POTASSIUM: 3.9 meq/L (ref 3.7–5.3)
SODIUM: 144 meq/L (ref 137–147)
TOTAL PROTEIN: 7.5 g/dL (ref 6.0–8.3)

## 2014-03-16 LAB — PROTIME-INR
INR: 0.99 (ref 0.00–1.49)
PROTHROMBIN TIME: 12.9 s (ref 11.6–15.2)

## 2014-03-16 LAB — I-STAT CG4 LACTIC ACID, ED
LACTIC ACID, VENOUS: 2.15 mmol/L (ref 0.5–2.2)
Lactic Acid, Venous: 2.51 mmol/L — ABNORMAL HIGH (ref 0.5–2.2)

## 2014-03-16 LAB — POC URINE PREG, ED: PREG TEST UR: NEGATIVE

## 2014-03-16 MED ORDER — SODIUM CHLORIDE 0.9 % IV BOLUS (SEPSIS)
1000.0000 mL | Freq: Once | INTRAVENOUS | Status: AC
  Administered 2014-03-16: 1000 mL via INTRAVENOUS

## 2014-03-16 MED ORDER — PANTOPRAZOLE SODIUM 40 MG PO TBEC
40.0000 mg | DELAYED_RELEASE_TABLET | Freq: Once | ORAL | Status: AC
  Administered 2014-03-17: 40 mg via ORAL
  Filled 2014-03-16: qty 1

## 2014-03-16 MED ORDER — IPRATROPIUM-ALBUTEROL 0.5-2.5 (3) MG/3ML IN SOLN
3.0000 mL | RESPIRATORY_TRACT | Status: AC
  Administered 2014-03-16 (×2): 3 mL via RESPIRATORY_TRACT
  Filled 2014-03-16 (×2): qty 3

## 2014-03-16 MED ORDER — METHYLPREDNISOLONE SODIUM SUCC 125 MG IJ SOLR
125.0000 mg | Freq: Once | INTRAMUSCULAR | Status: AC
  Administered 2014-03-16: 125 mg via INTRAVENOUS
  Filled 2014-03-16: qty 2

## 2014-03-16 MED ORDER — IOHEXOL 300 MG/ML  SOLN: 25.0000 mL | INTRAMUSCULAR | Status: AC

## 2014-03-16 NOTE — ED Notes (Signed)
CG-4 result shown to Dr. Walden 

## 2014-03-16 NOTE — ED Provider Notes (Signed)
CSN: [id number]     Arrival date & time 03/16/14  2116 History   First MD Initiated Contact with Patient 03/16/14 2120     Chief Complaint  Patient presents with  . Respiratory Distress  . Chest Pain  . Rectal Bleeding     (Consider location/radiation/quality/duration/timing/severity/associated sxs/prior Treatment) Patient is a 53 y.o. male presenting with chest pain, hematochezia, and shortness of breath. The history is provided by the patient.  Chest Pain Associated symptoms: no cough, no fever, no shortness of breath and not vomiting   Rectal Bleeding Quality:  Maroon Amount:  Moderate Duration:  3 days Timing:  Constant Progression:  Unchanged Chronicity:  New Context: not anal fissures, not anal penetration, not diarrhea and not foreign body   Similar prior episodes: no   Relieved by:  Nothing Worsened by:  Nothing tried Associated symptoms: no fever and no vomiting   Shortness of Breath Severity:  Mild Onset quality:  Gradual Duration:  2 days Timing:  Constant Progression:  Worsening Chronicity:  New Context: not URI   Relieved by:  Nothing Worsened by:  Nothing tried Associated symptoms: chest pain   Associated symptoms: no cough, no fever, no rash and no vomiting     Past Medical History  Diagnosis Date  . Testicular cancer     s/p resection  . Tobacco abuse   . Alcohol abuse   . Trauma     left arm (shoulder and elbow) s/p surgery   Past Surgical History  Procedure Laterality Date  . Elbow surgery Left     July 2014  . Orchiectomy     Family History  Problem Relation Age of Onset  . Stroke Maternal Grandmother    History  Substance Use Topics  . Smoking status: Current Every Day Smoker -- 2.00 packs/day for 40 years    Types: Cigarettes  . Smokeless tobacco: Not on file  . Alcohol Use: 18.0 oz/week    30 Cans of beer per week    Review of Systems  Constitutional: Negative for fever.  Respiratory: Negative for cough and shortness of  breath.   Cardiovascular: Positive for chest pain.  Gastrointestinal: Positive for hematochezia. Negative for vomiting.  Skin: Negative for rash.  All other systems reviewed and are negative.      Allergies  Review of patient's allergies indicates no known allergies.  Home Medications   Current Outpatient Rx  Name  Route  Sig  Dispense  Refill  . HYDROcodone-acetaminophen (NORCO) 10-325 MG per tablet   Oral   Take 1 tablet by mouth every 3 (three) hours as needed for pain.         . metFORMIN (GLUCOPHAGE) 500 MG tablet   Oral   Take 500 mg by mouth 2 (two) times daily.         . Multiple Vitamins-Minerals (CENTRUM SILVER ADULT 50+ PO)   Oral   Take 1 tablet by mouth daily.         Marland Kitchen. omeprazole (PRILOSEC) 20 MG capsule   Oral   Take 20 mg by mouth daily.          BP 147/105  Pulse 103  Temp(Src) 98.7 F (37.1 C) (Oral)  Ht 5\' 9"  (1.753 m)  Wt 198 lb (89.812 kg)  BMI 29.23 kg/m2  SpO2 98% Physical Exam  Nursing note and vitals reviewed. Constitutional: He is oriented to person, place, and time. He appears well-developed and well-nourished. No distress.  HENT:  Head: Normocephalic and atraumatic.  Mouth/Throat: No oropharyngeal exudate.  Eyes: EOM are normal. Pupils are equal, round, and reactive to light.  Neck: Normal range of motion. Neck supple.  Cardiovascular: Normal rate and regular rhythm.  Exam reveals no friction rub.   No murmur heard. Pulmonary/Chest: Effort normal and breath sounds normal. No respiratory distress. He has no wheezes. He has no rales.  Abdominal: Soft. He exhibits no distension. There is tenderness (LLQ, suprapubic). There is no rebound.  Genitourinary: Guaiac positive stool (small amount of gross blood on exam).  Musculoskeletal: Normal range of motion. He exhibits no edema.  Neurological: He is alert and oriented to person, place, and time. No cranial nerve deficit. He exhibits normal muscle tone. Coordination normal.  Skin:  No rash noted. He is not diaphoretic.    ED Course  Procedures (including critical care time) Labs Review Labs Reviewed  CBC - Abnormal; Notable for the following:    RBC 4.14 (*)    All other components within normal limits  COMPREHENSIVE METABOLIC PANEL - Abnormal; Notable for the following:    Glucose, Bld 159 (*)    All other components within normal limits  URINALYSIS, ROUTINE W REFLEX MICROSCOPIC - Abnormal; Notable for the following:    Color, Urine AMBER (*)    Bilirubin Urine SMALL (*)    Ketones, ur 15 (*)    All other components within normal limits  POC OCCULT BLOOD, ED - Abnormal; Notable for the following:    Fecal Occult Bld POSITIVE (*)    All other components within normal limits  PROTIME-INR  I-STAT CG4 LACTIC ACID, ED  POC URINE PREG, ED  I-STAT CHEM 8, ED   Imaging Review No results found.   EKG Interpretation   Date/Time:  Tuesday March 16 2014 21:31:12 EDT Ventricular Rate:  101 PR Interval:  171 QRS Duration: 92 QT Interval:  338 QTC Calculation: 438 R Axis:   3 Text Interpretation:  Sinus tachycardia Consider anterior infarct No prior  Confirmed by Gwendolyn Grant  MD, Jadzia Ibsen ([id number]) on 03/16/2014 9:41:31 PM      MDM   Final diagnoses:  Melena  Shortness of breath  Wheezing    59M presents from his doctor's office with melena. Orthostatic at his doctor's office. Patient also having some difficulty breathing and coughing for past few days. Smoker, no hx of COPD. No vomiting or diarrhea.  Here vitals with some tachycardia, normal BPs. Belly with middle lower pain, LLQ pain. Hemoccult positive with small amount of gross blood with brown stool. Lungs with diffuse wheezing. Will give steroids and duo-nebs, check CXR. Will get labs. Plan to admit for GI bleed. Labs stable hemoglobin, normal BUN, unlikely upper GI bleed. Scanning abdomen to look for possible cause of his LGIB.  CT normal. I spoke with Dr. Arlyce Dice, who will see patient in the morning.  Admitted to medicine.  Dagmar Hait, MD 03/17/14 [phone]

## 2014-03-16 NOTE — ED Notes (Signed)
Patient saw primary care MD today for rectal bleeding and shortness of breath, coughing.  Refused admission at this time and continued to feel worse

## 2014-03-17 ENCOUNTER — Inpatient Hospital Stay (HOSPITAL_COMMUNITY): Payer: 59

## 2014-03-17 ENCOUNTER — Encounter (HOSPITAL_COMMUNITY): Payer: Self-pay | Admitting: Physician Assistant

## 2014-03-17 ENCOUNTER — Encounter (HOSPITAL_COMMUNITY): Payer: 59 | Admitting: Anesthesiology

## 2014-03-17 ENCOUNTER — Encounter (HOSPITAL_COMMUNITY)
Admission: EM | Disposition: A | Discharge status: Home / Self Care | Payer: Self-pay | Source: Home / Self Care | Attending: Internal Medicine

## 2014-03-17 ENCOUNTER — Inpatient Hospital Stay (HOSPITAL_COMMUNITY): Payer: 59 | Admitting: Anesthesiology

## 2014-03-17 DIAGNOSIS — K921 Melena: Secondary | ICD-10-CM | POA: Insufficient documentation

## 2014-03-17 DIAGNOSIS — K922 Gastrointestinal hemorrhage, unspecified: Secondary | ICD-10-CM | POA: Diagnosis present

## 2014-03-17 DIAGNOSIS — R062 Wheezing: Secondary | ICD-10-CM

## 2014-03-17 DIAGNOSIS — R0602 Shortness of breath: Secondary | ICD-10-CM

## 2014-03-17 DIAGNOSIS — F17200 Nicotine dependence, unspecified, uncomplicated: Secondary | ICD-10-CM | POA: Diagnosis present

## 2014-03-17 DIAGNOSIS — J441 Chronic obstructive pulmonary disease with (acute) exacerbation: Secondary | ICD-10-CM | POA: Diagnosis present

## 2014-03-17 DIAGNOSIS — E119 Type 2 diabetes mellitus without complications: Secondary | ICD-10-CM

## 2014-03-17 DIAGNOSIS — R634 Abnormal weight loss: Secondary | ICD-10-CM | POA: Diagnosis present

## 2014-03-17 HISTORY — PX: ESOPHAGOGASTRODUODENOSCOPY (EGD) WITH PROPOFOL: SHX5813

## 2014-03-17 LAB — CBC WITH DIFFERENTIAL/PLATELET
BASOS PCT: 0 % (ref 0–1)
Basophils Absolute: 0 10*3/uL (ref 0.0–0.1)
EOS ABS: 0 10*3/uL (ref 0.0–0.7)
Eosinophils Relative: 0 % (ref 0–5)
HEMATOCRIT: 38.1 % — AB (ref 39.0–52.0)
Hemoglobin: 12.8 g/dL — ABNORMAL LOW (ref 13.0–17.0)
Lymphocytes Relative: 13 % (ref 12–46)
Lymphs Abs: 1.1 10*3/uL (ref 0.7–4.0)
MCH: 32.2 pg (ref 26.0–34.0)
MCHC: 33.6 g/dL (ref 30.0–36.0)
MCV: 95.7 fL (ref 78.0–100.0)
MONO ABS: 0 10*3/uL — AB (ref 0.1–1.0)
MONOS PCT: 0 % — AB (ref 3–12)
Neutro Abs: 7.5 10*3/uL (ref 1.7–7.7)
Neutrophils Relative %: 87 % — ABNORMAL HIGH (ref 43–77)
Platelets: 174 10*3/uL (ref 150–400)
RBC: 3.98 MIL/uL — ABNORMAL LOW (ref 4.22–5.81)
RDW: 12.9 % (ref 11.5–15.5)
WBC: 8.6 10*3/uL (ref 4.0–10.5)

## 2014-03-17 LAB — COMPREHENSIVE METABOLIC PANEL
ALK PHOS: 79 U/L (ref 39–117)
ALT: 44 U/L (ref 0–53)
AST: 21 U/L (ref 0–37)
Albumin: 3.7 g/dL (ref 3.5–5.2)
BUN: 9 mg/dL (ref 6–23)
CO2: 21 meq/L (ref 19–32)
Calcium: 9.6 mg/dL (ref 8.4–10.5)
Chloride: 103 mEq/L (ref 96–112)
Creatinine, Ser: 0.69 mg/dL (ref 0.50–1.35)
GLUCOSE: 211 mg/dL — AB (ref 70–99)
POTASSIUM: 4 meq/L (ref 3.7–5.3)
Sodium: 140 mEq/L (ref 137–147)
TOTAL PROTEIN: 6.9 g/dL (ref 6.0–8.3)
Total Bilirubin: 0.3 mg/dL (ref 0.3–1.2)

## 2014-03-17 LAB — GLUCOSE, CAPILLARY
GLUCOSE-CAPILLARY: 192 mg/dL — AB (ref 70–99)
GLUCOSE-CAPILLARY: 140 mg/dL — AB (ref 70–99)
Glucose-Capillary: 222 mg/dL — ABNORMAL HIGH (ref 70–99)
Glucose-Capillary: 173 mg/dL — ABNORMAL HIGH (ref 70–99)

## 2014-03-17 LAB — INFLUENZA PANEL BY PCR (TYPE A & B)
H1N1 flu by pcr: NOT DETECTED
Influenza A By PCR: NEGATIVE
Influenza B By PCR: NEGATIVE

## 2014-03-17 SURGERY — ESOPHAGOGASTRODUODENOSCOPY (EGD) WITH PROPOFOL
Anesthesia: Monitor Anesthesia Care

## 2014-03-17 MED ORDER — BENZONATATE 100 MG PO CAPS
100.0000 mg | ORAL_CAPSULE | Freq: Once | ORAL | Status: AC
  Administered 2014-03-17: 100 mg via ORAL
  Filled 2014-03-17: qty 1

## 2014-03-17 MED ORDER — LEVOFLOXACIN IN D5W 750 MG/150ML IV SOLN
750.0000 mg | INTRAVENOUS | Status: DC
  Administered 2014-03-18: 750 mg via INTRAVENOUS
  Filled 2014-03-17 (×3): qty 150

## 2014-03-17 MED ORDER — GUAIFENESIN ER 600 MG PO TB12
600.0000 mg | ORAL_TABLET | Freq: Two times a day (BID) | ORAL | Status: DC
  Administered 2014-03-17: 600 mg via ORAL
  Filled 2014-03-17 (×2): qty 1

## 2014-03-17 MED ORDER — ALBUTEROL SULFATE (2.5 MG/3ML) 0.083% IN NEBU
2.5000 mg | INHALATION_SOLUTION | Freq: Four times a day (QID) | RESPIRATORY_TRACT | Status: DC
  Administered 2014-03-17 (×2): 2.5 mg via RESPIRATORY_TRACT
  Filled 2014-03-17 (×2): qty 3

## 2014-03-17 MED ORDER — IPRATROPIUM-ALBUTEROL 0.5-2.5 (3) MG/3ML IN SOLN: 3.0000 mL | Freq: Three times a day (TID) | RESPIRATORY_TRACT | Status: DC

## 2014-03-17 MED ORDER — INSULIN ASPART 100 UNIT/ML ~~LOC~~ SOLN
0.0000 [IU] | Freq: Four times a day (QID) | SUBCUTANEOUS | Status: DC
  Administered 2014-03-17: 3 [IU] via SUBCUTANEOUS
  Administered 2014-03-17: 1 [IU] via SUBCUTANEOUS
  Administered 2014-03-17: 2 [IU] via SUBCUTANEOUS
  Administered 2014-03-18: 3 [IU] via SUBCUTANEOUS
  Administered 2014-03-18: 2 [IU] via SUBCUTANEOUS

## 2014-03-17 MED ORDER — SODIUM CHLORIDE 0.9 % IJ SOLN
3.0000 mL | Freq: Two times a day (BID) | INTRAMUSCULAR | Status: DC
  Administered 2014-03-17 – 2014-03-19 (×2): 3 mL via INTRAVENOUS

## 2014-03-17 MED ORDER — PEG-KCL-NACL-NASULF-NA ASC-C 100 G PO SOLR
0.5000 | Freq: Once | ORAL | Status: AC
  Administered 2014-03-18: 100 g via ORAL

## 2014-03-17 MED ORDER — PEG-KCL-NACL-NASULF-NA ASC-C 100 G PO SOLR
0.5000 | Freq: Once | ORAL | Status: AC
  Administered 2014-03-17: 100 g via ORAL
  Filled 2014-03-17: qty 1

## 2014-03-17 MED ORDER — ALBUTEROL SULFATE (2.5 MG/3ML) 0.083% IN NEBU: 2.5000 mg | INHALATION_SOLUTION | RESPIRATORY_TRACT | Status: DC

## 2014-03-17 MED ORDER — PANTOPRAZOLE SODIUM 40 MG IV SOLR
40.0000 mg | Freq: Two times a day (BID) | INTRAVENOUS | Status: DC
  Administered 2014-03-17 (×2): 40 mg via INTRAVENOUS
  Filled 2014-03-17 (×5): qty 40

## 2014-03-17 MED ORDER — METHYLPREDNISOLONE SODIUM SUCC 125 MG IJ SOLR
60.0000 mg | Freq: Four times a day (QID) | INTRAMUSCULAR | Status: DC
  Administered 2014-03-17 – 2014-03-18 (×6): 60 mg via INTRAVENOUS
  Filled 2014-03-17 (×10): qty 0.96

## 2014-03-17 MED ORDER — INSULIN GLARGINE 100 UNIT/ML ~~LOC~~ SOLN
4.0000 [IU] | Freq: Every day | SUBCUTANEOUS | Status: DC
  Administered 2014-03-18 – 2014-03-19 (×2): 4 [IU] via SUBCUTANEOUS
  Filled 2014-03-17 (×3): qty 0.04

## 2014-03-17 MED ORDER — PROPOFOL INFUSION 10 MG/ML OPTIME
INTRAVENOUS | Status: DC | PRN
  Administered 2014-03-17: 140 ug/kg/min via INTRAVENOUS

## 2014-03-17 MED ORDER — GUAIFENESIN ER 600 MG PO TB12
1200.0000 mg | ORAL_TABLET | Freq: Two times a day (BID) | ORAL | Status: DC
  Administered 2014-03-17 – 2014-03-19 (×4): 1200 mg via ORAL
  Filled 2014-03-17 (×6): qty 2

## 2014-03-17 MED ORDER — ONDANSETRON 8 MG/NS 50 ML IVPB: 8.0000 mg | Freq: Four times a day (QID) | INTRAVENOUS | Status: DC | PRN

## 2014-03-17 MED ORDER — HYDROMORPHONE HCL PF 1 MG/ML IJ SOLN
0.5000 mg | INTRAMUSCULAR | Status: DC | PRN
  Administered 2014-03-17 – 2014-03-18 (×3): 0.5 mg via INTRAVENOUS
  Filled 2014-03-17 (×3): qty 1

## 2014-03-17 MED ORDER — IPRATROPIUM BROMIDE 0.02 % IN SOLN: 0.5000 mg | RESPIRATORY_TRACT | Status: DC

## 2014-03-17 MED ORDER — HYDROMORPHONE HCL PF 1 MG/ML IJ SOLN
1.0000 mg | Freq: Once | INTRAMUSCULAR | Status: AC
  Administered 2014-03-17: 1 mg via INTRAVENOUS
  Filled 2014-03-17: qty 1

## 2014-03-17 MED ORDER — IPRATROPIUM-ALBUTEROL 0.5-2.5 (3) MG/3ML IN SOLN
3.0000 mL | Freq: Three times a day (TID) | RESPIRATORY_TRACT | Status: DC
  Administered 2014-03-17 – 2014-03-18 (×2): 3 mL via RESPIRATORY_TRACT
  Filled 2014-03-17 (×2): qty 3

## 2014-03-17 MED ORDER — PROPOFOL 10 MG/ML IV BOLUS
INTRAVENOUS | Status: DC | PRN
  Administered 2014-03-17: 150 mg via INTRAVENOUS
  Administered 2014-03-17: 50 mg via INTRAVENOUS

## 2014-03-17 MED ORDER — IPRATROPIUM BROMIDE 0.02 % IN SOLN
0.5000 mg | Freq: Four times a day (QID) | RESPIRATORY_TRACT | Status: DC
  Administered 2014-03-17 (×2): 0.5 mg via RESPIRATORY_TRACT
  Filled 2014-03-17 (×2): qty 2.5

## 2014-03-17 MED ORDER — OSELTAMIVIR PHOSPHATE 75 MG PO CAPS
75.0000 mg | ORAL_CAPSULE | Freq: Two times a day (BID) | ORAL | Status: DC
  Administered 2014-03-17 (×3): 75 mg via ORAL
  Filled 2014-03-17 (×5): qty 1

## 2014-03-17 MED ORDER — SODIUM CHLORIDE 0.9 % IV BOLUS (SEPSIS)
1000.0000 mL | Freq: Once | INTRAVENOUS | Status: AC
  Administered 2014-03-17: 1000 mL via INTRAVENOUS

## 2014-03-17 MED ORDER — ONDANSETRON HCL 4 MG PO TABS: 4.0000 mg | ORAL_TABLET | Freq: Four times a day (QID) | ORAL | Status: DC | PRN

## 2014-03-17 MED ORDER — SODIUM CHLORIDE 0.9 % IV SOLN
INTRAVENOUS | Status: DC
  Administered 2014-03-17: 20 mL/h via INTRAVENOUS

## 2014-03-17 MED ORDER — NICOTINE 21 MG/24HR TD PT24
21.0000 mg | MEDICATED_PATCH | Freq: Every day | TRANSDERMAL | Status: DC
  Administered 2014-03-17 – 2014-03-19 (×4): 21 mg via TRANSDERMAL
  Filled 2014-03-17 (×3): qty 1

## 2014-03-17 MED ORDER — SODIUM CHLORIDE 0.9 % IV SOLN
INTRAVENOUS | Status: DC
  Administered 2014-03-17 – 2014-03-18 (×2): via INTRAVENOUS

## 2014-03-17 MED ORDER — PEG-KCL-NACL-NASULF-NA ASC-C 100 G PO SOLR: 1.0000 | Freq: Once | ORAL | Status: DC

## 2014-03-17 MED ORDER — ALBUTEROL SULFATE (2.5 MG/3ML) 0.083% IN NEBU: 2.5000 mg | INHALATION_SOLUTION | RESPIRATORY_TRACT | Status: DC | PRN

## 2014-03-17 MED ORDER — MENTHOL 3 MG MT LOZG
1.0000 | LOZENGE | OROMUCOSAL | Status: DC | PRN
  Administered 2014-03-17: 3 mg via ORAL
  Filled 2014-03-17 (×2): qty 9

## 2014-03-17 MED ORDER — BENZONATATE 100 MG PO CAPS
100.0000 mg | ORAL_CAPSULE | Freq: Three times a day (TID) | ORAL | Status: DC | PRN
  Administered 2014-03-17 (×2): 100 mg via ORAL
  Filled 2014-03-17 (×3): qty 1

## 2014-03-17 MED ORDER — LACTATED RINGERS IV SOLN
INTRAVENOUS | Status: DC | PRN
  Administered 2014-03-17: 13:00:00 via INTRAVENOUS

## 2014-03-17 MED ORDER — IOHEXOL 300 MG/ML  SOLN
100.0000 mL | Freq: Once | INTRAMUSCULAR | Status: AC | PRN
  Administered 2014-03-17: 100 mL via INTRAVENOUS

## 2014-03-17 NOTE — Anesthesia Postprocedure Evaluation (Signed)
  Anesthesia Post-op Note  Patient: Brandon SkeensPablo Alvarez  Procedure(s) Performed: Procedure(s): ESOPHAGOGASTRODUODENOSCOPY (EGD) WITH PROPOFOL (N/A)  Patient Location: PACU  Anesthesia Type:MAC  Level of Consciousness: awake, alert , oriented and patient cooperative  Airway and Oxygen Therapy: Patient Spontanous Breathing  Post-op Pain: none  Post-op Assessment: Post-op Vital signs reviewed, Patient's Cardiovascular Status Stable, Respiratory Function Stable, Patent Airway, No signs of Nausea or vomiting and Pain level controlled  Post-op Vital Signs: stable  Complications: No apparent anesthesia complications

## 2014-03-17 NOTE — Transfer of Care (Signed)
Immediate Anesthesia Transfer of Care Note  Patient: Brandon Alvarez  Procedure(s) Performed: Procedure(s): ESOPHAGOGASTRODUODENOSCOPY (EGD) WITH PROPOFOL (N/A)  Patient Location: PACU and Endoscopy Unit  Anesthesia Type:MAC  Level of Consciousness: awake, alert , oriented and patient cooperative  Airway & Oxygen Therapy: Patient Spontanous Breathing and Patient connected to nasal cannula oxygen  Post-op Assessment: Report given to PACU RN and Post -op Vital signs reviewed and stable  Post vital signs: Reviewed and stable  Complications: No apparent anesthesia complications

## 2014-03-17 NOTE — Progress Notes (Signed)
Inpatient Diabetes Program Recommendations  AACE/ADA: New Consensus Statement on Inpatient Glycemic Control (2013)  Target Ranges:  Prepandial:   less than 140 mg/dL      Peak postprandial:   less than 180 mg/dL (1-2 hours)      Critically ill patients:  140 - 180 mg/dL   No documentation of diabetes, however pt has been taking metformin 500 mg bid. Inpatient Diabetes Program Recommendations HgbA1C: Please check current HgbA1C. Last one was in August of 2014 at 7.9%. Pt most probably will need some basal while on solumedrol.  Thank you, Lenor CoffinAnn Shirleymae Hauth, RN, CNS, Diabetes Coordinator [phone])

## 2014-03-17 NOTE — Consult Note (Signed)
                                                                           [facility] Consult: 9:24 AM 03/17/2014  LOS: 1 day    Referring Provider: Dr Patel  Primary Care Physician:  Brandon F, NP Primary Gastroenterologist:     Reason for Consultation:  Gi bleed    HPI: Brandon Alvarez is a type 2 diabetic 53 y.o.hispanic male.  Pt denies hx of orchiectomy for testicular cancer.   Born in [place], raised in [place], emig[place] to [place] 1980[place].  Presented last night with 3 weeks anorexia, 12 # weight loss, one week hx of maroon bloody and balck stools.  One day of non-productive cough and dyspnea.  +nausea and non-bloody emesis yeasterday AM. + lower abdominal discomfort. Hgb is 12.8 this AM c/w 13.9 last night.  Baseline is at least 14 in August 2104.  Coags normal.  Mcv normal.  On somewhat motion degraded CT the liver and abdomen are unremarkable. Incidental right inguinal hernia noted.   ETOH consumption heavy up until about 10 years ago, no etoh for one year or more.  Smokes 7 to 10 cigarettes daily and has 80 pack year history.  Denies hx ulcers, GERD but does take Prevacid daily on advice of his ortho MD Gramig.  No NSAIDs/ASA.  Never had GI bleed or required transfusion.  No change in bowel habits.  No previous EGD or colonoscopy.      Past Medical History  Diagnosis Date  . Tobacco abuse   . Alcohol abuse   . Trauma     left arm (shoulder and elbow) s/p surgery  . Diabetes   . Type 2 diabetes mellitus 2014    Past Surgical History  Procedure Laterality Date  . Elbow surgery Left     July 2014  . Hand surgery    . Shoulder surgery      Prior to Admission medications   Medication Sig Start Date End Date Taking? Authorizing Provider  HYDROcodone-acetaminophen (NORCO) 10-325 MG per tablet Take 1 tablet by mouth every 3 (three) hours as needed for pain.   Yes  Historical Provider, MD  metFORMIN (GLUCOPHAGE) 500 MG tablet Take 500 mg by mouth 2 (two) times daily. 12/22/13  Yes Historical Provider, MD  Multiple Vitamins-Minerals (CENTRUM SILVER ADULT 50+ PO) Take 1 tablet by mouth daily.   Yes Historical Provider, MD  omeprazole (PRILOSEC) 20 MG capsule Take 20 mg by mouth daily.   Yes Historical Provider, MD    Scheduled Meds: . albuterol  2.5 mg Nebulization Q6H  . guaiFENesin  600 mg Oral BID  . insulin aspart  0-9 Units Subcutaneous Q6H  . ipratropium  0.5 mg Nebulization Q6H  . methylPREDNISolone (SOLU-MEDROL) injection  60 mg Intravenous Q6H  . oseltamivir  75 mg Oral BID  . pantoprazole (PROTONIX) IV  40 mg Intravenous Q12H  . sodium chloride  3 mL Intravenous Q12H   Infusions: . sodium chloride 100 mL/hr at 03/17/14 0220   PRN Meds: benzonatate, HYDROmorphone (DILAUDID) injection, menthol-cetylpyridinium, ondansetron (ZOFRAN) IV, ondansetron   Allergies as of 03/16/2014  . (No Known Allergies)    Family History  Problem Relation Age   of Onset  . Stroke Maternal Grandmother     History   Social History  . Marital Status: Single    Spouse Name: N/A    Number of Children: N/A  . Years of Education: N/A   Occupational History  . Manual Labor     unemployed   Social History Main Topics  . Smoking status: Current Every Day Smoker -- 0.50 packs/day    Types: Cigarettes  . Smokeless tobacco: Not on file  . Alcohol Use: No     Comment: previous heavy etoh till ~2005, quit completely in 2014.   . Drug Use: Not on file  . Sexual Activity: Not on file   Other Topics Concern  . Not on file   Social History Narrative  . No narrative on file    REVIEW OF SYSTEMS: Constitutional:  Generally weak in last week or son ENT:  No nose bleeds Pulm:  Dry cough.  Better after nebulizer. No previous dx of asthma, or use of MDIs CV:  No palpitations, no LE edema.  GU:  No hematuria, no frequency GI:  No dysphagia, no  pyrosis Heme:  No anemia   Transfusions:  none Neuro:  No headaches, no peripheral tingling or numbness Derm:  No itching, no rash or sores.  Endocrine:  No sweats or chills.  No polyuria or dysuria Immunization:  Does not take flu shots, says they make him sick.     PHYSICAL EXAM: Vital signs in last 24 hours: Filed Vitals:   03/17/14 0836  BP: 138/80  Pulse:   Temp:   Resp:    Wt Readings from Last 3 Encounters:  03/17/14 89.7 kg (197 lb 12 oz)  01/18/14 90.719 kg (200 lb)  07/23/13 93.2 kg (205 lb 7.5 oz)   General: overweight hispanic man who appears stated age.  Looks unwell but not toxic. Unable to stop coughing Head:  No asymmetry or swellilng  Eyes:  No icterus or pallor Ears:  No HOH  Nose:  No discharge or congestion Mouth:  Clear and moist.  No blood or ulcers Neck:  Prominent JVD when coughing.  No mass Lungs:  Paroxysmal, dry cough. Having to speak around the coughing spells.  No wheezes.  Some crackles in bases Heart: RRR.  No MRG Abdomen:  Soft, no mass or HSM.  Active BS.  Not distended.   Rectal: deferred   Musc/Skeltl: no joint deformity or swelling Extremities:  No pedal edema  Neurologic:  Oriented x 3. No limb weakness Skin:  No telangectasia, ulcers or suspicious lesions Tattoos:  None Nodes:  No cervical adenopathy   Psych:  Seems sad, engaged in conversation.  Slightly anxious.   Intake/Output from previous day: 03/31 0701 - 04/01 0700 In: 266.7 [I.V.:266.7] Out: -  Intake/Output this shift:    LAB RESULTS:  Recent Labs  03/16/14 2151 03/17/14 0349  WBC 9.8 8.6  HGB 13.9 12.8*  HCT 39.3 38.1*  PLT 191 174  MCV    95  BMET Lab Results  Component Value Date   NA 140 03/17/2014   NA 144 03/16/2014   NA 141 01/18/2014   K 4.0 03/17/2014   K 3.9 03/16/2014   K 3.9 01/18/2014   CL 103 03/17/2014   CL 103 03/16/2014   CL 101 01/18/2014   CO2 21 03/17/2014   CO2 24 03/16/2014   CO2 22 01/18/2014   GLUCOSE 211* 03/17/2014   GLUCOSE 159* 03/16/2014    GLUCOSE 159* 01/18/2014     BUN 9 03/17/2014   BUN 8 03/16/2014   BUN 10 01/18/2014   CREATININE 0.69 03/17/2014   CREATININE 0.80 03/16/2014   CREATININE 0.72 01/18/2014   CALCIUM 9.6 03/17/2014   CALCIUM 9.3 03/16/2014   CALCIUM 9.3 01/18/2014   LFT  Recent Labs  03/16/14 2151 03/17/14 0349  PROT 7.5 6.9  ALBUMIN 4.0 3.7  AST 22 21  ALT 49 44  ALKPHOS 84 79  BILITOT 0.3 0.3   PT/INR Lab Results  Component Value Date   INR 0.99 03/16/2014    RADIOLOGY STUDIES: Dg Chest 2 View 03/16/2014   CLINICAL DATA:  Cough and wheeze for 3 days  EXAM: CHEST  2 VIEW  COMPARISON:  01/18/2014  FINDINGS: Hypoaeration with diffuse interstitial coarsening. There is mild cardiomegaly and upper mediastinal widening, accentuated by low lung volumes. The lower esophagus is dilated by gas in the lateral projection. No significant effusion. No pneumothorax. Negative osseous structures.  IMPRESSION: 1. Coarsened lung markings which could be congestive or bronchitic. 2. Low lung volumes.   Electronically Signed   By: Jonathan  Watts M.D.   On: 03/16/2014 23:13   Ct Abdomen Pelvis W Contrast 03/17/2014    FINDINGS: Lower Chest: Dependent atelectasis in the lower lungs. Visualized cardiac structures within normal limits for size. No pericardial effusion. Unremarkable visualized distal thoracic esophagus.  Abdomen: Unremarkable CT appearance of the stomach, duodenum, spleen, adrenal glands and pancreas. Normal hepatic contours and morphology. No discrete hepatic lesion. Gallbladder is unremarkable. No intra or extrahepatic biliary ductal dilatation. Unremarkable appearance of the bilateral kidneys. No focal solid lesion, hydronephrosis or nephrolithiasis.  Image quality degradation secondary to motion predominantly affecting the lower abdomen an anatomic pelvis. No evidence of obstruction or focal bowel wall thickening. Normal appendix in the right lower quadrant. The terminal ileum is unremarkable. No significant colonic  diverticular disease. No free fluid or suspicious adenopathy.  Pelvis: Motion degradation. Unremarkable bladder, prostate gland and seminal vesicles. No free fluid or suspicious adenopathy. Small fat containing right inguinal hernia.  Bones/Soft Tissues: No acute fracture or aggressive appearing lytic or blastic osseous lesion.  Vascular: Trace atherosclerotic vascular calcification without aneurysmal dilatation or significant appearing stenosis.  IMPRESSION: Mildly limited evaluation of the lower abdominal and pelvic contents secondary to patient motion.  No acute abnormality in the abdomen or pelvis to explain the patient's clinical symptoms.   Electronically Signed   By: Heath  McCullough M.D.   On: 03/17/2014 00:30    ENDOSCOPIC STUDIES: None ever  IMPRESSION:   *  Black and maroon bloody stools.  Suspect UGI bleed but could be lower GIB  *  Type 2 DM.   *  Dry cough without hypoxia. Bronchitis on xray.   *  Hypertension.    PLAN:     *  EGD today. Continue the BID Protonix.  May need codeine for cough. CBC in AM.    Sarah Gribbin  03/17/2014, 9:24 AM Pager: [phone]     ________________________________________________________________________  [facility] MD note:  I personally examined the patient, reviewed the data and agree with the assessment and plan described above. For EGD today.  If not clear source located, then colonoscopy probably tomorrow.   Erikka Follmer, MD [facility] Pager [phone]  

## 2014-03-17 NOTE — Progress Notes (Signed)
Utilization Review Completed.Brandon Alvarez T4/12/2013  

## 2014-03-17 NOTE — Anesthesia Preprocedure Evaluation (Addendum)
Anesthesia Evaluation  Patient identified by MRN, date of birth, ID band Patient awake    Reviewed: Allergy & Precautions, H&P , NPO status , Patient's Chart, lab work & pertinent test results  Airway Mallampati: II TM Distance: >3 FB     Dental  (+) Dental Advisory Given, Missing,    Pulmonary COPDCurrent Smoker,    Pulmonary exam normal       Cardiovascular Exercise Tolerance: Poor negative cardio ROS  Rhythm:Regular Rate:Normal  Study Conclusions 2 D ECHO - Left ventricle: The cavity size was normal. Systolic   function was normal. The estimated ejection fraction was   in the range of 55% to 60%. Wall motion was normal; there   were no regional wall motion abnormalities. Doppler   parameters are consistent with abnormal left ventricular   relaxation (grade 1 diastolic dysfunction). - Aortic valve: Trileaflet; normal thickness, mildly    Neuro/Psych negative neurological ROS  negative psych ROS   GI/Hepatic negative GI ROS, Neg liver ROS,   Endo/Other  negative endocrine ROSdiabetes, Well Controlled, Type 2  Renal/GU negative Renal ROS  negative genitourinary   Musculoskeletal  (+) Arthritis -, Osteoarthritis,    Abdominal   Peds  Hematology   Anesthesia Other Findings   Reproductive/Obstetrics                      Anesthesia Physical Anesthesia Plan  ASA: II  Anesthesia Plan: MAC   Post-op Pain Management:    Induction: Intravenous  Airway Management Planned: Simple Face Mask  Additional Equipment:   Intra-op Plan:   Post-operative Plan:   Informed Consent: I have reviewed the patients History and Physical, chart, labs and discussed the procedure including the risks, benefits and alternatives for the proposed anesthesia with the patient or authorized representative who has indicated his/her understanding and acceptance.     Plan Discussed with:   Anesthesia Plan  Comments:         Anesthesia Quick Evaluation

## 2014-03-17 NOTE — Progress Notes (Addendum)
I have seen and examined Brandon Alvarez at bedside and reviewed his chart. Appreciate Gi. Patient was admitted this morning with melena and shortness of breath. He is being treated  For an Acute Exacerbation of COPD, and he has been by GI. Appreciate GI help. Patient to have  EGD today. Added antibiotics. Please refer to Dr Eliane DecreePatel's care plan for rest of management from medical standpoint.

## 2014-03-17 NOTE — H&P (View-Only) (Signed)
[facility] Consult: 9:24 AM 03/17/2014  LOS: 1 day    Referring Provider: Dr Allena Katz  Primary Care Physician:  Dema Severin, NP Primary Gastroenterologist:     Reason for Consultation:  Sandria Manly bleed    HPI: Brandon Alvarez is a type 2 diabetic 53 y.o.hispanic male.  Pt denies hx of orchiectomy for testicular cancer.   Born in [place], raised in [place], emigrated to [place] 1980s.  Presented last night with 3 weeks anorexia, 12 # weight loss, one week hx of maroon bloody and balck stools.  One day of non-productive cough and dyspnea.  +nausea and non-bloody emesis yeasterday AM. + lower abdominal discomfort. Hgb is 12.8 this AM c/w 13.9 last night.  Baseline is at least 14 in August 2104.  Coags normal.  Mcv normal.  On somewhat motion degraded CT the liver and abdomen are unremarkable. Incidental right inguinal hernia noted.   ETOH consumption heavy up until about 10 years ago, no etoh for one year or more.  Smokes 7 to 10 cigarettes daily and has 80 pack year history.  Denies hx ulcers, GERD but does take Prevacid daily on advice of his ortho MD Gramig.  No NSAIDs/ASA.  Never had GI bleed or required transfusion.  No change in bowel habits.  No previous EGD or colonoscopy.      Past Medical History  Diagnosis Date  . Tobacco abuse   . Alcohol abuse   . Trauma     left arm (shoulder and elbow) s/p surgery  . Diabetes   . Type 2 diabetes mellitus 2014    Past Surgical History  Procedure Laterality Date  . Elbow surgery Left     July 2014  . Hand surgery    . Shoulder surgery      Prior to Admission medications   Medication Sig Start Date End Date Taking? Authorizing Provider  HYDROcodone-acetaminophen (NORCO) 10-325 MG per tablet Take 1 tablet by mouth every 3 (three) hours as needed for pain.   Yes  Historical Provider, MD  metFORMIN (GLUCOPHAGE) 500 MG tablet Take 500 mg by mouth 2 (two) times daily. 12/22/13  Yes Historical Provider, MD  Multiple Vitamins-Minerals (CENTRUM SILVER ADULT 50+ PO) Take 1 tablet by mouth daily.   Yes Historical Provider, MD  omeprazole (PRILOSEC) 20 MG capsule Take 20 mg by mouth daily.   Yes Historical Provider, MD    Scheduled Meds: . albuterol  2.5 mg Nebulization Q6H  . guaiFENesin  600 mg Oral BID  . insulin aspart  0-9 Units Subcutaneous Q6H  . ipratropium  0.5 mg Nebulization Q6H  . methylPREDNISolone (SOLU-MEDROL) injection  60 mg Intravenous Q6H  . oseltamivir  75 mg Oral BID  . pantoprazole (PROTONIX) IV  40 mg Intravenous Q12H  . sodium chloride  3 mL Intravenous Q12H   Infusions: . sodium chloride 100 mL/hr at 03/17/14 0220   PRN Meds: benzonatate, HYDROmorphone (DILAUDID) injection, menthol-cetylpyridinium, ondansetron (ZOFRAN) IV, ondansetron   Allergies as of 03/16/2014  . (No Known Allergies)    Family History  Problem Relation Age  of Onset  . Stroke Maternal Grandmother     History   Social History  . Marital Status: Single    Spouse Name: N/A    Number of Children: N/A  . Years of Education: N/A   Occupational History  . Manual Labor     unemployed   Social History Main Topics  . Smoking status: Current Every Day Smoker -- 0.50 packs/day    Types: Cigarettes  . Smokeless tobacco: Not on file  . Alcohol Use: No     Comment: previous heavy etoh till ~2005, quit completely in 2014.   . Drug Use: Not on file  . Sexual Activity: Not on file   Other Topics Concern  . Not on file   Social History Narrative  . No narrative on file    REVIEW OF SYSTEMS: Constitutional:  Generally weak in last week or son ENT:  No nose bleeds Pulm:  Dry cough.  Better after nebulizer. No previous dx of asthma, or use of MDIs CV:  No palpitations, no LE edema.  GU:  No hematuria, no frequency GI:  No dysphagia, no  pyrosis Heme:  No anemia   Transfusions:  none Neuro:  No headaches, no peripheral tingling or numbness Derm:  No itching, no rash or sores.  Endocrine:  No sweats or chills.  No polyuria or dysuria Immunization:  Does not take flu shots, says they make him sick.     PHYSICAL EXAM: Vital signs in last 24 hours: Filed Vitals:   03/17/14 0836  BP: 138/80  Pulse:   Temp:   Resp:    Wt Readings from Last 3 Encounters:  03/17/14 89.7 kg (197 lb 12 oz)  01/18/14 90.719 kg (200 lb)  07/23/13 93.2 kg (205 lb 7.5 oz)   General: overweight hispanic man who appears stated age.  Looks unwell but not toxic. Unable to stop coughing Head:  No asymmetry or swellilng  Eyes:  No icterus or pallor Ears:  No HOH  Nose:  No discharge or congestion Mouth:  Clear and moist.  No blood or ulcers Neck:  Prominent JVD when coughing.  No mass Lungs:  Paroxysmal, dry cough. Having to speak around the coughing spells.  No wheezes.  Some crackles in bases Heart: RRR.  No MRG Abdomen:  Soft, no mass or HSM.  Active BS.  Not distended.   Rectal: deferred   Musc/Skeltl: no joint deformity or swelling Extremities:  No pedal edema  Neurologic:  Oriented x 3. No limb weakness Skin:  No telangectasia, ulcers or suspicious lesions Tattoos:  None Nodes:  No cervical adenopathy   Psych:  Seems sad, engaged in conversation.  Slightly anxious.   Intake/Output from previous day: 03/31 0701 - 04/01 0700 In: 266.7 [I.V.:266.7] Out: -  Intake/Output this shift:    LAB RESULTS:  Recent Labs  03/16/14 2151 03/17/14 0349  WBC 9.8 8.6  HGB 13.9 12.8*  HCT 39.3 38.1*  PLT 191 174  MCV    95  BMET Lab Results  Component Value Date   NA 140 03/17/2014   NA 144 03/16/2014   NA 141 01/18/2014   K 4.0 03/17/2014   K 3.9 03/16/2014   K 3.9 01/18/2014   CL 103 03/17/2014   CL 103 03/16/2014   CL 101 01/18/2014   CO2 21 03/17/2014   CO2 24 03/16/2014   CO2 22 01/18/2014   GLUCOSE 211* 03/17/2014   GLUCOSE 159* 03/16/2014    GLUCOSE 159* 01/18/2014  BUN 9 03/17/2014   BUN 8 03/16/2014   BUN 10 01/18/2014   CREATININE 0.69 03/17/2014   CREATININE 0.80 03/16/2014   CREATININE 0.72 01/18/2014   CALCIUM 9.6 03/17/2014   CALCIUM 9.3 03/16/2014   CALCIUM 9.3 01/18/2014   LFT  Recent Labs  03/16/14 2151 03/17/14 0349  PROT 7.5 6.9  ALBUMIN 4.0 3.7  AST 22 21  ALT 49 44  ALKPHOS 84 79  BILITOT 0.3 0.3   PT/INR Lab Results  Component Value Date   INR 0.99 03/16/2014    RADIOLOGY STUDIES: Dg Chest 2 View 03/16/2014   CLINICAL DATA:  Cough and wheeze for 3 days  EXAM: CHEST  2 VIEW  COMPARISON:  01/18/2014  FINDINGS: Hypoaeration with diffuse interstitial coarsening. There is mild cardiomegaly and upper mediastinal widening, accentuated by low lung volumes. The lower esophagus is dilated by gas in the lateral projection. No significant effusion. No pneumothorax. Negative osseous structures.  IMPRESSION: 1. Coarsened lung markings which could be congestive or bronchitic. 2. Low lung volumes.   Electronically Signed   By: Tiburcio Pea M.D.   On: 03/16/2014 23:13   Ct Abdomen Pelvis W Contrast 03/17/2014    FINDINGS: Lower Chest: Dependent atelectasis in the lower lungs. Visualized cardiac structures within normal limits for size. No pericardial effusion. Unremarkable visualized distal thoracic esophagus.  Abdomen: Unremarkable CT appearance of the stomach, duodenum, spleen, adrenal glands and pancreas. Normal hepatic contours and morphology. No discrete hepatic lesion. Gallbladder is unremarkable. No intra or extrahepatic biliary ductal dilatation. Unremarkable appearance of the bilateral kidneys. No focal solid lesion, hydronephrosis or nephrolithiasis.  Image quality degradation secondary to motion predominantly affecting the lower abdomen an anatomic pelvis. No evidence of obstruction or focal bowel wall thickening. Normal appendix in the right lower quadrant. The terminal ileum is unremarkable. No significant colonic  diverticular disease. No free fluid or suspicious adenopathy.  Pelvis: Motion degradation. Unremarkable bladder, prostate gland and seminal vesicles. No free fluid or suspicious adenopathy. Small fat containing right inguinal hernia.  Bones/Soft Tissues: No acute fracture or aggressive appearing lytic or blastic osseous lesion.  Vascular: Trace atherosclerotic vascular calcification without aneurysmal dilatation or significant appearing stenosis.  IMPRESSION: Mildly limited evaluation of the lower abdominal and pelvic contents secondary to patient motion.  No acute abnormality in the abdomen or pelvis to explain the patient's clinical symptoms.   Electronically Signed   By: Malachy Moan M.D.   On: 03/17/2014 00:30    ENDOSCOPIC STUDIES: None ever  IMPRESSION:   *  Black and maroon bloody stools.  Suspect UGI bleed but could be lower GIB  *  Type 2 DM.   *  Dry cough without hypoxia. Bronchitis on xray.   *  Hypertension.    PLAN:     *  EGD today. Continue the BID Protonix.  May need codeine for cough. CBC in AM.    Jennye Moccasin  03/17/2014, 9:24 AM Pager: [phone]     ________________________________________________________________________  [facility] MD note:  I personally examined the patient, reviewed the data and agree with the assessment and plan described above. For EGD today.  If not clear source located, then colonoscopy probably tomorrow.   Rob Bunting, MD [facility] Pager [phone]

## 2014-03-17 NOTE — Progress Notes (Signed)
INITIAL NUTRITION ASSESSMENT  DOCUMENTATION CODES Per approved criteria  -Not Applicable   INTERVENTION: Advance diet as medically appropriate, add interventions accordingly RD to follow for nutrition care plan  NUTRITION DIAGNOSIS: Inadequate oral intake related to inability to eat as evidenced by NPO status  Goal: Pt to meet >/= 90% of their estimated nutrition needs   Monitor:  PO diet advancement & intake, weight, labs, I/O's  Reason for Assessment: Malnutrition Screening Tool Report  53 y.o. male  Admitting Dx: GI bleed  ASSESSMENT: Patient with PMH of type 2 DM; presented with respiratory distress, chest pain and rectal bleeding; CT the liver and abdomen are unremarkable.  Patient s/p EGD -- will need colonoscopy to continue GI workup.  Patient reports his appetite has been poor for ~ 1 week; states he was consuming only fruit and saltine crackers during this time; reports a 12 lb weight loss, however, wt readings do not reflect this; pt at nutrition risk -- to remain NPO for further testing in AM.  No muscle or subcutaneous fat depletion noticed.  Height: Ht Readings from Last 1 Encounters:  03/17/14 5' 9"  (1.753 m)    Weight: Wt Readings from Last 1 Encounters:  03/17/14 197 lb 12 oz (89.7 kg)    Ideal Body Weight: 160 lb  % Ideal Body Weight: 123%  Wt Readings from Last 10 Encounters:  03/17/14 197 lb 12 oz (89.7 kg)  03/17/14 197 lb 12 oz (89.7 kg)  01/18/14 200 lb (90.719 kg)  07/23/13 205 lb 7.5 oz (93.2 kg)    Usual Body Weight: 205 lb  % Usual Body Weight: 96%  BMI:  Body mass index is 29.19 kg/(m^2).  Estimated Nutritional Needs: Kcal: 2000-2200 Protein: 100-110 gm Fluid: 2.0-2.2 L  Skin: Intact  Diet Order: NPO  EDUCATION NEEDS: -No education needs identified at this time   Intake/Output Summary (Last 24 hours) at 03/17/14 1548 Last data filed at 03/17/14 1320  Gross per 24 hour  Intake 366.67 ml  Output      0 ml  Net  366.67 ml    Labs:   Recent Labs Lab 03/16/14 2151 03/17/14 0349  NA 144 140  K 3.9 4.0  CL 103 103  CO2 24 21  BUN 8 9  CREATININE 0.80 0.69  CALCIUM 9.3 9.6  GLUCOSE 159* 211*    CBG (last 3)   Recent Labs  03/17/14 0228 03/17/14 0655 03/17/14 1121  GLUCAP 222* 173* 192*    Scheduled Meds: . albuterol  2.5 mg Nebulization Q6H  . guaiFENesin  1,200 mg Oral BID  . insulin aspart  0-9 Units Subcutaneous Q6H  . insulin glargine  4 Units Subcutaneous Daily  . ipratropium  0.5 mg Nebulization Q6H  . levofloxacin (LEVAQUIN) IV  750 mg Intravenous Q24H  . methylPREDNISolone (SOLU-MEDROL) injection  60 mg Intravenous Q6H  . nicotine  21 mg Transdermal Daily  . oseltamivir  75 mg Oral BID  . pantoprazole (PROTONIX) IV  40 mg Intravenous Q12H  . peg 3350 powder  0.5 kit Oral Once   And  . [START ON 03/18/2014] peg 3350 powder  0.5 kit Oral Once  . sodium chloride  3 mL Intravenous Q12H    Continuous Infusions: . sodium chloride 100 mL/hr at 03/17/14 0220  . sodium chloride      Past Medical History  Diagnosis Date  . Tobacco abuse   . Alcohol abuse   . Trauma     left arm (shoulder and elbow)  s/p surgery  . Diabetes   . Type 2 diabetes mellitus 2014    Past Surgical History  Procedure Laterality Date  . Elbow surgery Left     July 2014  . Hand surgery    . Shoulder surgery      Arthur Holms, RD, LDN Pager #: [phone] After-Hours Pager #: [phone]

## 2014-03-17 NOTE — Op Note (Signed)
[facility] [address] [place], [id number]   ENDOSCOPY PROCEDURE REPORT  PATIENT: Brandon Alvarez, Kamon  MR#: [id number] BIRTHDATE: 12/30/1960 , 52  yrs. old GENDER: Male ENDOSCOPIST: Rachael Feeaniel P Darothy Courtright, MD REFERRED by: [facility] PROCEDURE DATE:  03/17/2014 PROCEDURE:  EGD, diagnostic ASA CLASS:     Class III INDICATIONS:  abdominal discomfort, dark then red rectal bleeding.Marland Kitchen.  MEDICATIONS: MAC sedation, administered by CRNA TOPICAL ANESTHETIC: none  DESCRIPTION OF PROCEDURE: After the risks benefits and alternatives of the procedure were thoroughly explained, informed consent was obtained.  The Pentax Gastroscope [id number] endoscope was introduced through the mouth and advanced to the second portion of the duodenum. Without limitations.  The instrument was slowly withdrawn as the mucosa was fully examined.    The upper, middle and distal third of the esophagus were carefully inspected and no abnormalities were noted.  The z-line was well seen at the GEJ.  The endoscope was pushed into the fundus which was normal including a retroflexed view.  The antrum, gastric body, first and second part of the duodenum were unremarkable. Retroflexed views revealed no abnormalities.     The scope was then withdrawn from the patient and the procedure completed.  COMPLICATIONS: There were no complications. ENDOSCOPIC IMPRESSION: Normal EGD  RECOMMENDATIONS: He will need colonoscopy to continue the GI workup.  WIll set this up for tomorroow.   eSigned:  Rachael Feeaniel P Anniemae Haberkorn, MD 03/17/2014 1:30 PM

## 2014-03-17 NOTE — Interval H&P Note (Signed)
History and Physical Interval Note:  03/17/2014 12:58 PM  Brandon Alvarez  has presented today for surgery, with the diagnosis of gi bleed  The various methods of treatment have been discussed with the patient and family. After consideration of risks, benefits and other options for treatment, the patient has consented to  Procedure(s): ESOPHAGOGASTRODUODENOSCOPY (EGD) WITH PROPOFOL (N/A) as a surgical intervention .  The patient's history has been reviewed, patient examined, no change in status, stable for surgery.  I have reviewed the patient's chart and labs.  Questions were answered to the patient's satisfaction.     Rachael FeeJacobs, Julien Berryman P

## 2014-03-17 NOTE — H&P (Signed)
[facility] History and Physical  Patient: Brandon Alvarez  NWG:[id number]  DOB: 11/12/1961  DOS: the patient was seen and examined on 03/17/2014 PCP: Dema SeverinYORK,[name], NP  Chief Complaint: Melena  HPI: Brandon Skeensablo Rahal is a 53 y.o. male with Past medical history of testicular cancer, alcohol abuse, tobacco abuse. The patient presents with complaints of bright red blood per rectum as well as melena. He mentions that since last one week he has been having loose black color bowel movements. His bowel movement has done dark maroon to bright red today. He also has baseline shortness of breath which is progressively worsening. He mentions he is active smoker. This morning he started having cough,along with progressive worsening of shortness of breath. He denies any fever or chills. He does not bring up any phlegm. He denies any acid reflux but does have nausea there and had an episode of vomiting in the morning without any blood. He complains of pain in his lower abdomen. He denies any burning urination leg swelling orthopnea or recent travel. He mentions he is compliant with his medicationand denies any sick contacts.  The patient is coming from home. And at her baseline Independent for most of his  ADL.  Review of Systems: as mentioned in the history of present illness.  A Comprehensive review of the other systems is negative.  Past Medical History  Diagnosis Date  . Testicular cancer     s/p resection  . Tobacco abuse   . Alcohol abuse   . Trauma     left arm (shoulder and elbow) s/p surgery   Past Surgical History  Procedure Laterality Date  . Elbow surgery Left     July 2014  . Orchiectomy     Social History:  reports that he has been smoking Cigarettes.  He has a 80 pack-year smoking history. He does not have any smokeless tobacco history on file. He reports that he drinks about 18.0 ounces of alcohol per week. His drug history is not on file.  No Known Allergies  Family History   Problem Relation Age of Onset  . Stroke Maternal Grandmother     Prior to Admission medications   Medication Sig Start Date End Date Taking? Authorizing Provider  HYDROcodone-acetaminophen (NORCO) 10-325 MG per tablet Take 1 tablet by mouth every 3 (three) hours as needed for pain.   Yes Historical Provider, MD  metFORMIN (GLUCOPHAGE) 500 MG tablet Take 500 mg by mouth 2 (two) times daily. 12/22/13  Yes Historical Provider, MD  Multiple Vitamins-Minerals (CENTRUM SILVER ADULT 50+ PO) Take 1 tablet by mouth daily.   Yes Historical Provider, MD  omeprazole (PRILOSEC) 20 MG capsule Take 20 mg by mouth daily.   Yes Historical Provider, MD    Physical Exam: Filed Vitals:   03/16/14 2122 03/16/14 2123 03/17/14 0111 03/17/14 0148  BP: 147/105  126/74   Pulse: 103  102   Temp: 98.7 F (37.1 C)   98.4 F (36.9 C)  TempSrc: Oral   Oral  Resp:   29   Height: 5\' 9"  (1.753 m)     Weight: 89.812 kg (198 lb)     SpO2: 98% 98% 97%     General: Alert, Awake and Oriented to Time, Place and Person. Appear in moderate distress Eyes: PERRL ENT: Oral Mucosa clear moist. Neck: no JVD Cardiovascular: S1 and S2 Present, aortic systolic Murmur, Peripheral Pulses Present Respiratory: Bilateral Air entry equal and Decreased, coarse breath sound occasional bilateral Crackles,occasional expiratory wheezes Abdomen: Bowel  Sound Present, Soft and diffuse mild lower abdomen tender without any guarding or rigidity Skin: no Rash Extremities: no Pedal edema, no calf tenderness Neurologic: Grossly Unremarkable.  Labs on Admission:  CBC:  Recent Labs Lab 03/16/14 2151  WBC 9.8  HGB 13.9  HCT 39.3  MCV 94.9  PLT 191    CMP     Component Value Date/Time   NA 144 03/16/2014 2151   K 3.9 03/16/2014 2151   CL 103 03/16/2014 2151   CO2 24 03/16/2014 2151   GLUCOSE 159* 03/16/2014 2151   BUN 8 03/16/2014 2151   CREATININE 0.80 03/16/2014 2151   CALCIUM 9.3 03/16/2014 2151   PROT 7.5 03/16/2014 2151   ALBUMIN  4.0 03/16/2014 2151   AST 22 03/16/2014 2151   ALT 49 03/16/2014 2151   ALKPHOS 84 03/16/2014 2151   BILITOT 0.3 03/16/2014 2151   GFRNONAA >90 03/16/2014 2151   GFRAA >90 03/16/2014 2151    No results found for this basename: LIPASE, AMYLASE,  in the last 168 hours No results found for this basename: AMMONIA,  in the last 168 hours  No results found for this basename: CKTOTAL, CKMB, CKMBINDEX, TROPONINI,  in the last 168 hours BNP (last 3 results) No results found for this basename: PROBNP,  in the last 8760 hours  Radiological Exams on Admission: Dg Chest 2 View  03/16/2014   CLINICAL DATA:  Cough and wheeze for 3 days  EXAM: CHEST  2 VIEW  COMPARISON:  01/18/2014  FINDINGS: Hypoaeration with diffuse interstitial coarsening. There is mild cardiomegaly and upper mediastinal widening, accentuated by low lung volumes. The lower esophagus is dilated by gas in the lateral projection. No significant effusion. No pneumothorax. Negative osseous structures.  IMPRESSION: 1. Coarsened lung markings which could be congestive or bronchitic. 2. Low lung volumes.   Electronically Signed   By: Tiburcio Pea M.D.   On: 03/16/2014 23:13   Ct Abdomen Pelvis W Contrast  03/17/2014   CLINICAL DATA:  Bloody stools, suprapubic and left lower quadrant pain  EXAM: CT ABDOMEN AND PELVIS WITH CONTRAST  TECHNIQUE: Multidetector CT imaging of the abdomen and pelvis was performed using the standard protocol following bolus administration of intravenous contrast.  CONTRAST:  OMNIPAQUE IOHEXOL 300 MG/ML  SOLN  COMPARISON:  Prior CT abdomen/ pelvis 01/06/2007  FINDINGS: Lower Chest: Dependent atelectasis in the lower lungs. Visualized cardiac structures within normal limits for size. No pericardial effusion. Unremarkable visualized distal thoracic esophagus.  Abdomen: Unremarkable CT appearance of the stomach, duodenum, spleen, adrenal glands and pancreas. Normal hepatic contours and morphology. No discrete hepatic lesion.  Gallbladder is unremarkable. No intra or extrahepatic biliary ductal dilatation. Unremarkable appearance of the bilateral kidneys. No focal solid lesion, hydronephrosis or nephrolithiasis.  Image quality degradation secondary to motion predominantly affecting the lower abdomen an anatomic pelvis. No evidence of obstruction or focal bowel wall thickening. Normal appendix in the right lower quadrant. The terminal ileum is unremarkable. No significant colonic diverticular disease. No free fluid or suspicious adenopathy.  Pelvis: Motion degradation. Unremarkable bladder, prostate gland and seminal vesicles. No free fluid or suspicious adenopathy. Small fat containing right inguinal hernia.  Bones/Soft Tissues: No acute fracture or aggressive appearing lytic or blastic osseous lesion.  Vascular: Trace atherosclerotic vascular calcification without aneurysmal dilatation or significant appearing stenosis.  IMPRESSION: Mildly limited evaluation of the lower abdominal and pelvic contents secondary to patient motion.  No acute abnormality in the abdomen or pelvis to explain the patient's clinical symptoms.  Electronically Signed   By: Malachy Moan M.D.   On: 03/17/2014 00:30    EKG: Independently reviewed. sinus tachycardia.  Assessment/Plan Principal Problem:   GI bleed Active Problems:   Possible COPD exacerbation   Smoker   Loss of weight   1. GI bleed The patient is presenting with complaints of black color bowel movements. He is Hemoccult positive. His hemoglobin is stable at present. He does not appear to have any coagulopathy or liver abnormality. A CT scan of the abdomen is negative for any acute intra-abdominal pathology. At present the patient will be admitted to the hospital in telemetry, IV Protonix every 12 hours, IV fluids, CBC q. 8 hours, gastroenterology consult, n.p.o., Pain management with Dilaudid, nausea medications Zofran,  2.Possible COPD exacerbation Patient has long-standing  smoking history he does not have any diagnosis of COPD. But he does have significant cough, respiratory distress, and bilateral expiratory wheezing and coarse breath sounds. With this the patient will be treated for possible COPD exacerbation with Solu-Medrol, nebulizations. I would place him on Tamiflu empirically and check influenza PCR. If he does not improve he may be placed on IV antibiotics as well. Lozenges and Tessalon Perles for symptom management.  3.loss of weight Patient has complaint of GI bleeding with loss of weight and loss of appetite since last 3 weeks. Once his acute issues are resolved he may require further workup for his loss of weight.  4.diabetes mellitus Patient is on metformin at home. At present I would hold his metformin and place him on insulin sliding scale.  Consults: gastroenterology  DVT Prophylaxis: mechanical compression device Nutrition: n.p.o.  Code Status: full  Disposition: Admitted to inpatient in telemetry unit.  Author: Lynden Oxford, MD [facility] Pager: [phone] 03/17/2014, 1:50 AM    If 7PM-7AM, please contact night-coverage [email or web address] Password [email or web address]

## 2014-03-18 ENCOUNTER — Encounter (HOSPITAL_COMMUNITY): Payer: 59 | Admitting: Anesthesiology

## 2014-03-18 ENCOUNTER — Inpatient Hospital Stay (HOSPITAL_COMMUNITY): Payer: 59 | Admitting: Anesthesiology

## 2014-03-18 ENCOUNTER — Encounter (HOSPITAL_COMMUNITY): Payer: Self-pay | Admitting: Gastroenterology

## 2014-03-18 ENCOUNTER — Encounter (HOSPITAL_COMMUNITY)
Admission: EM | Disposition: A | Discharge status: Home / Self Care | Payer: Self-pay | Source: Home / Self Care | Attending: Internal Medicine

## 2014-03-18 DIAGNOSIS — K649 Unspecified hemorrhoids: Secondary | ICD-10-CM | POA: Insufficient documentation

## 2014-03-18 DIAGNOSIS — K921 Melena: Principal | ICD-10-CM

## 2014-03-18 DIAGNOSIS — R739 Hyperglycemia, unspecified: Secondary | ICD-10-CM | POA: Diagnosis present

## 2014-03-18 HISTORY — PX: COLONOSCOPY WITH PROPOFOL: SHX5780

## 2014-03-18 LAB — GLUCOSE, CAPILLARY
Glucose-Capillary: 169 mg/dL — ABNORMAL HIGH (ref 70–99)
Glucose-Capillary: 185 mg/dL — ABNORMAL HIGH (ref 70–99)
Glucose-Capillary: 207 mg/dL — ABNORMAL HIGH (ref 70–99)
Glucose-Capillary: 226 mg/dL — ABNORMAL HIGH (ref 70–99)
Glucose-Capillary: 235 mg/dL — ABNORMAL HIGH (ref 70–99)

## 2014-03-18 LAB — HEMOGLOBIN A1C
Hgb A1c MFr Bld: 7.7 % — ABNORMAL HIGH (ref <5.7)
MEAN PLASMA GLUCOSE: 174 mg/dL — AB (ref <117)

## 2014-03-18 LAB — COMPREHENSIVE METABOLIC PANEL
ALT: 46 U/L (ref 0–53)
AST: 24 U/L (ref 0–37)
Albumin: 3.9 g/dL (ref 3.5–5.2)
Alkaline Phosphatase: 76 U/L (ref 39–117)
BILIRUBIN TOTAL: 0.4 mg/dL (ref 0.3–1.2)
BUN: 11 mg/dL (ref 6–23)
CALCIUM: 9.5 mg/dL (ref 8.4–10.5)
CO2: 21 mEq/L (ref 19–32)
CREATININE: 0.63 mg/dL (ref 0.50–1.35)
Chloride: 106 mEq/L (ref 96–112)
GFR calc non Af Amer: >90 mL/min (ref >90)
Glucose, Bld: 210 mg/dL — ABNORMAL HIGH (ref 70–99)
Potassium: 3.9 mEq/L (ref 3.7–5.3)
Sodium: 143 mEq/L (ref 137–147)
Total Protein: 7.1 g/dL (ref 6.0–8.3)

## 2014-03-18 LAB — CBC
HEMATOCRIT: 36.8 % — AB (ref 39.0–52.0)
Hemoglobin: 12.8 g/dL — ABNORMAL LOW (ref 13.0–17.0)
MCH: 32.8 pg (ref 26.0–34.0)
MCHC: 34.8 g/dL (ref 30.0–36.0)
MCV: 94.4 fL (ref 78.0–100.0)
Platelets: 199 10*3/uL (ref 150–400)
RBC: 3.9 MIL/uL — ABNORMAL LOW (ref 4.22–5.81)
RDW: 13.2 % (ref 11.5–15.5)
WBC: 12.2 10*3/uL — ABNORMAL HIGH (ref 4.0–10.5)

## 2014-03-18 LAB — PHOSPHORUS: PHOSPHORUS: 3.2 mg/dL (ref 2.3–4.6)

## 2014-03-18 LAB — MAGNESIUM: Magnesium: 2.2 mg/dL (ref 1.5–2.5)

## 2014-03-18 SURGERY — COLONOSCOPY WITH PROPOFOL
Anesthesia: Monitor Anesthesia Care

## 2014-03-18 MED ORDER — FENTANYL CITRATE 0.05 MG/ML IJ SOLN
INTRAMUSCULAR | Status: DC | PRN
  Administered 2014-03-18 (×2): 50 ug via INTRAVENOUS

## 2014-03-18 MED ORDER — HYDROCORTISONE 1 % EX CREA
TOPICAL_CREAM | Freq: Two times a day (BID) | CUTANEOUS | Status: DC
  Administered 2014-03-18 – 2014-03-19 (×2): via TOPICAL
  Filled 2014-03-18: qty 28

## 2014-03-18 MED ORDER — ACETAMINOPHEN-CODEINE #3 300-30 MG PO TABS
2.0000 | ORAL_TABLET | Freq: Once | ORAL | Status: AC
  Administered 2014-03-18: 2 via ORAL
  Filled 2014-03-18: qty 2

## 2014-03-18 MED ORDER — LACTATED RINGERS IV SOLN
INTRAVENOUS | Status: DC | PRN
  Administered 2014-03-18: 12:00:00 via INTRAVENOUS

## 2014-03-18 MED ORDER — MIDAZOLAM HCL 5 MG/5ML IJ SOLN
INTRAMUSCULAR | Status: DC | PRN
  Administered 2014-03-18: 2 mg via INTRAVENOUS

## 2014-03-18 MED ORDER — ACETAMINOPHEN-CODEINE #3 300-30 MG PO TABS
1.0000 | ORAL_TABLET | Freq: Once | ORAL | Status: AC
  Administered 2014-03-18: 1 via ORAL
  Filled 2014-03-18: qty 2

## 2014-03-18 MED ORDER — PROPOFOL INFUSION 10 MG/ML OPTIME
INTRAVENOUS | Status: DC | PRN
  Administered 2014-03-18: 100 ug/kg/min via INTRAVENOUS

## 2014-03-18 MED ORDER — INSULIN ASPART 100 UNIT/ML ~~LOC~~ SOLN
0.0000 [IU] | Freq: Every day | SUBCUTANEOUS | Status: DC
  Administered 2014-03-18: 2 [IU] via SUBCUTANEOUS

## 2014-03-18 MED ORDER — INSULIN ASPART 100 UNIT/ML ~~LOC~~ SOLN
0.0000 [IU] | Freq: Three times a day (TID) | SUBCUTANEOUS | Status: DC
  Administered 2014-03-19: 3 [IU] via SUBCUTANEOUS
  Administered 2014-03-19: 2 [IU] via SUBCUTANEOUS

## 2014-03-18 MED ORDER — INSULIN ASPART 100 UNIT/ML ~~LOC~~ SOLN: 0.0000 [IU] | Freq: Four times a day (QID) | SUBCUTANEOUS | Status: DC

## 2014-03-18 MED ORDER — PREDNISONE 50 MG PO TABS
50.0000 mg | ORAL_TABLET | Freq: Every day | ORAL | Status: DC
  Administered 2014-03-18 – 2014-03-19 (×2): 50 mg via ORAL
  Filled 2014-03-18 (×3): qty 1

## 2014-03-18 MED ORDER — IPRATROPIUM-ALBUTEROL 0.5-2.5 (3) MG/3ML IN SOLN: 3.0000 mL | Freq: Four times a day (QID) | RESPIRATORY_TRACT | Status: DC | PRN

## 2014-03-18 NOTE — Transfer of Care (Signed)
Immediate Anesthesia Transfer of Care Note  Patient: Brandon Alvarez  Procedure(s) Performed: Procedure(s): COLONOSCOPY WITH PROPOFOL (N/A)  Patient Location: Endoscopy Unit  Anesthesia Type:MAC  Level of Consciousness: awake, alert  and oriented  Airway & Oxygen Therapy: Patient Spontanous Breathing and Patient connected to face mask oxygen  Post-op Assessment: Report given to PACU RN, Post -op Vital signs reviewed and stable and Patient moving all extremities X 4  Post vital signs: Reviewed and stable  Complications: No apparent anesthesia complications

## 2014-03-18 NOTE — Progress Notes (Signed)
Inpatient Diabetes Program Recommendations  AACE/ADA: New Consensus Statement on Inpatient Glycemic Control (2013)  Target Ranges:  Prepandial:   less than 140 mg/dL      Peak postprandial:   less than 180 mg/dL (1-2 hours)      Critically ill patients:  140 - 180 mg/dL    Inpatient Diabetes Program Recommendations Insulin - Basal: While on Solumedrol, pt will need basal levemir/lantus at least 10 units to start (for basal needs and resistance due to Solumedrol) irregardless of NPO status HgbA1C: Please check current HgbA1C. Last one was in August of 2014 at 7.9%.  Thank you, Lenor CoffinAnn Verl Kitson, RN, CNS, Diabetes Coordinator [phone])

## 2014-03-18 NOTE — Interval H&P Note (Signed)
History and Physical Interval Note:  03/18/2014 12:22 PM  Brandon Alvarez  has presented today for surgery, with the diagnosis of colonoscopy  The various methods of treatment have been discussed with the patient and family. After consideration of risks, benefits and other options for treatment, the patient has consented to  Procedure(s): COLONOSCOPY WITH PROPOFOL (N/A) as a surgical intervention .  The patient's history has been reviewed, patient examined, no change in status, stable for surgery.  I have reviewed the patient's chart and labs.  Questions were answered to the patient's satisfaction.     Rachael FeeJacobs, Cleda Imel P

## 2014-03-18 NOTE — Anesthesia Postprocedure Evaluation (Signed)
  Anesthesia Post-op Note  Patient: Dedra SkeensPablo Raborn  Procedure(s) Performed: Procedure(s): COLONOSCOPY WITH PROPOFOL (N/A)  Patient Location: PACU and Endoscopy Unit  Anesthesia Type:MAC  Level of Consciousness: awake  Airway and Oxygen Therapy: Patient Spontanous Breathing  Post-op Pain: none  Post-op Assessment: Post-op Vital signs reviewed, Patient's Cardiovascular Status Stable, Respiratory Function Stable, Patent Airway, No signs of Nausea or vomiting and Pain level controlled  Post-op Vital Signs: Reviewed and stable  Complications: No apparent anesthesia complications

## 2014-03-18 NOTE — Op Note (Signed)
[facility] [address] [place], [id number]   COLONOSCOPY PROCEDURE REPORT  PATIENT: Brandon Alvarez, [name]  MR#: [id number] BIRTHDATE: 1961-08-05 , 52  yrs. old GENDER: Male ENDOSCOPIST: Rachael Feeaniel P Jacobs, MD REFERRED BY: [facility] PROCEDURE DATE:  03/18/2014 PROCEDURE:   Colonoscopy, diagnostic First Screening Colonoscopy - Avg.  risk and is 50 yrs.  old or older - No.  Prior Negative Screening - Now for repeat screening. N/A  History of Adenoma - Now for follow-up colonoscopy & has been > or = to 3 yrs.  N/A  Polyps Removed Today? No.  Recommend repeat exam, <10 yrs? No. ASA CLASS:   Class II INDICATIONS:rectal bleed; dark, bright red.  EGD yesterday was normal.  Hb nadir 12.8. MEDICATIONS: MAC sedation, administered by CRNA  DESCRIPTION OF PROCEDURE:   After the risks benefits and alternatives of the procedure were thoroughly explained, informed consent was obtained.  A digital rectal exam revealed hemorrhoids. The [employer] Adult Colon [id number]  endoscope was introduced through the anus and advanced to the cecum, which was identified by both the appendix and ileocecal valve. No adverse events experienced. The quality of the prep was excellent.  The instrument was then slowly withdrawn as the colon was fully examined.    COLON FINDINGS: There was no blood in the colon.  The colon mucosa was normal without polyps, inflammation or diverticulosis.  There were medius sized internal hemorrhoids and small external hemorrhoids.  The examination was otherwise normal.  Retroflexed views revealed no abnormalities. The time to cecum=3 minutes 00 seconds.  Withdrawal time=8 minutes 00 seconds.  The scope was withdrawn and the procedure completed. COMPLICATIONS: There were no complications.  ENDOSCOPIC IMPRESSION: There was no blood in the colon.  The colon mucosa was normal without polyps, inflammation or diverticulosis.  There were medius sized internal  hemorrhoids and small external hemorrhoids.  The examination was otherwise normal. Hemorrhoidal bleeding is most likely source of your bleeding given this examination and yesterdays normal EGD.  RECOMMENDATIONS: Treat hemorrhoids with OTC preparation H ointments as needed. Follow up with GI as needed. Repeat colonsocopy in 10 years for routine cancer screening. OK to d/c home today.   eSigned:  Rachael Feeaniel P Jacobs, MD 03/18/2014 12:58 PM

## 2014-03-18 NOTE — Anesthesia Procedure Notes (Signed)
Procedure Name: MAC Date/Time: 03/18/2014 12:39 PM Performed by: Quentin OreWALKER, Wei Newbrough E Pre-anesthesia Checklist: Patient identified, Emergency Drugs available, Suction available, Patient being monitored and Timeout performed Patient Re-evaluated:Patient Re-evaluated prior to inductionOxygen Delivery Method: Simple face mask Intubation Type: IV induction Placement Confirmation: positive ETCO2

## 2014-03-18 NOTE — Anesthesia Preprocedure Evaluation (Addendum)
Anesthesia Evaluation  Patient identified by MRN, date of birth, ID band Patient awake    Reviewed: Allergy & Precautions, H&P , NPO status , Patient's Chart, lab work & pertinent test results  Airway Mallampati: II TM Distance: >3 FB Neck ROM: Full    Dental  (+) Dental Advisory Given, Missing,    Pulmonary COPDCurrent Smoker,  + rhonchi   Pulmonary exam normal       Cardiovascular Exercise Tolerance: Poor negative cardio ROS  Rhythm:Regular Rate:Normal  Study Conclusions 2 D ECHO - Left ventricle: The cavity size was normal. Systolic   function was normal. The estimated ejection fraction was   in the range of 55% to 60%. Wall motion was normal; there   were no regional wall motion abnormalities. Doppler   parameters are consistent with abnormal left ventricular   relaxation (grade 1 diastolic dysfunction). - Aortic valve: Trileaflet; normal thickness, mildly    Neuro/Psych negative neurological ROS  negative psych ROS   GI/Hepatic negative GI ROS, Neg liver ROS, (+)     substance abuse  alcohol use,   Endo/Other  negative endocrine ROSdiabetes, Well Controlled, Type 2  Renal/GU negative Renal ROS  negative genitourinary   Musculoskeletal  (+) Arthritis -, Osteoarthritis,    Abdominal   Peds  Hematology   Anesthesia Other Findings   Reproductive/Obstetrics                          Anesthesia Physical Anesthesia Plan  ASA: III  Anesthesia Plan: MAC   Post-op Pain Management:    Induction: Intravenous  Airway Management Planned: Simple Face Mask  Additional Equipment:   Intra-op Plan:   Post-operative Plan:   Informed Consent: I have reviewed the patients History and Physical, chart, labs and discussed the procedure including the risks, benefits and alternatives for the proposed anesthesia with the patient or authorized representative who has indicated his/her  understanding and acceptance.     Plan Discussed with: CRNA and Surgeon  Anesthesia Plan Comments:         Anesthesia Quick Evaluation

## 2014-03-18 NOTE — Progress Notes (Signed)
          Daily Rounding Note  03/18/2014, 9:18 AM  LOS: 2 days   SUBJECTIVE:       Completed prep, this AM is passing red blood.  No abdominal pain, no nausea or emesis.  Feels dizzy at times  OBJECTIVE:         Vital signs in last 24 hours:    Temp:  [97.9 F (36.6 C)-98.7 F (37.1 C)] 97.9 F (36.6 C) (04/02 0526) Pulse Rate:  [90-105] 90 (04/02 0526) Resp:  [15-22] 18 (04/02 0526) BP: (124-150)/(62-88) 127/69 mmHg (04/02 0526) SpO2:  [93 %-99 %] 97 % (04/02 0815) Weight:  [88.043 kg (194 lb 1.6 oz)] 88.043 kg (194 lb 1.6 oz) (04/02 0500) Last BM Date: 03/18/14 General: looks unwell. comfortable   Heart: RRR.  No MRG Chest: clear but still with non-productive, high pitched cough and some dyspnea Abdomen: soft, NT, ND.  No mass, no HSM  Extremities: no CCE Neuro/Psych:  Pleasant, alert, not confused.   Intake/Output from previous day: 04/01 0701 - 04/02 0700 In: 220 [P.O.:120; I.V.:100] Out: -   Intake/Output this shift:    Lab Results:  Recent Labs  03/16/14 2151 03/17/14 0349 03/18/14 0554  WBC 9.8 8.6 12.2*  HGB 13.9 12.8* 12.8*  HCT 39.3 38.1* 36.8*  PLT 191 174 199   BMET  Recent Labs  03/16/14 2151 03/17/14 0349 03/18/14 0554  NA 144 140 143  K 3.9 4.0 3.9  CL 103 103 106  CO2 24 21 21   GLUCOSE 159* 211* 210*  BUN 8 9 11   CREATININE 0.80 0.69 0.63  CALCIUM 9.3 9.6 9.5   LFT  Recent Labs  03/16/14 2151 03/17/14 0349 03/18/14 0554  PROT 7.5 6.9 7.1  ALBUMIN 4.0 3.7 3.9  AST 22 21 24   ALT 49 44 46  ALKPHOS 84 79 76  BILITOT 0.3 0.3 0.4   PT/INR  Recent Labs  03/16/14 2151  LABPROT 12.9  INR 0.99    ASSESMENT:   *  GI bleed, now declaring itself as LGIB.  EGD 4/1 was normal.   *  Cough, bronchitis on CXR.  Serum flu tests all negative. On Levaquin, Solumedrol, Tamiflu, Tessalon, Mucinex, Cepacol.   PLAN   *  Tap water enema to clear blood, later this AM.  Colonoscopy  slated for 12:45 today. I stopped the Protonix.     Jennye MoccasinSarah Jody Silas  03/18/2014, 9:18 AM Pager: [phone]

## 2014-03-18 NOTE — Progress Notes (Signed)
[facility] PROGRESS NOTE  Brandon Alvarez GNF:[id number] DOB: 02/01/1961 DOA: 03/16/2014 PCP: Dema SeverinYORK,[name], NP  Brief Narrative Brandon Alvarez is a 53 y.o. male with Past medical history of testicular cancer, alcohol abuse, tobacco abuse. He presented with complaints of bright red blood per rectum as well as melena. He also reported that baseline shortness of breath which is progressively worsening. He mentions he is active smoker. He has since been found to have hemorrhoids after her EGD and colonoscopy, and also has acute exacerbation of COPD. He complains of persistent cough. Appreciate GI input. Plan: GI bleed/Hemorrhoids  Bleed Likely due to hemorrhoids  Hemoglobin stable  Preparation H per GI recommendation COPD exacerbation/Smoker/Loss of weight  Continues to have nonproductive cough  Smoking cessation counseling given  Add Tylenol No. 3  Continue antibiotic and systemic steroids Hyperglycemia  Likely steroid induced  SSI DVT/GI prophylaxis  SCDs  PPI  Code Status: Full Family Communication: Friends at bedside Disposition Plan:  DC home eventually   Consultants:  GI  Procedures:  EGD/Colonoscopy  Antibiotics:  Levaquin 03/17/14>  HPI/Subjective: Complains of cough. He is anxious.  Objective: Filed Vitals:   03/18/14 1542  BP: 133/70  Pulse: 100  Temp:   Resp: 20    Intake/Output Summary (Last 24 hours) at 03/18/14 1823 Last data filed at 03/18/14 1300  Gross per 24 hour  Intake    420 ml  Output      0 ml  Net    420 ml   Filed Weights   03/16/14 2122 03/17/14 0208 03/18/14 0500  Weight: 89.812 kg (198 lb) 89.7 kg (197 lb 12 oz) 88.043 kg (194 lb 1.6 oz)    Exam:   General:  Anxious. Coughing  Cardiovascular: S1S2. No murmurs. RRR.  Respiratory: Scattered wheezing bilaterally.  Abdomen: Soft and nontender  Musculoskeletal: No pedal edema  Data Reviewed: Basic Metabolic Panel:  Recent Labs Lab 03/16/14 2151  03/17/14 0349 03/18/14 0554  NA 144 140 143  K 3.9 4.0 3.9  CL 103 103 106  CO2 24 21 21   GLUCOSE 159* 211* 210*  BUN 8 9 11   CREATININE 0.80 0.69 0.63  CALCIUM 9.3 9.6 9.5  MG  --   --  2.2  PHOS  --   --  3.2   Liver Function Tests:  Recent Labs Lab 03/16/14 2151 03/17/14 0349 03/18/14 0554  AST 22 21 24   ALT 49 44 46  ALKPHOS 84 79 76  BILITOT 0.3 0.3 0.4  PROT 7.5 6.9 7.1  ALBUMIN 4.0 3.7 3.9   No results found for this basename: LIPASE, AMYLASE,  in the last 168 hours No results found for this basename: AMMONIA,  in the last 168 hours CBC:  Recent Labs Lab 03/16/14 2151 03/17/14 0349 03/18/14 0554  WBC 9.8 8.6 12.2*  NEUTROABS  --  7.5  --   HGB 13.9 12.8* 12.8*  HCT 39.3 38.1* 36.8*  MCV 94.9 95.7 94.4  PLT 191 174 199   Cardiac Enzymes: No results found for this basename: CKTOTAL, CKMB, CKMBINDEX, TROPONINI,  in the last 168 hours BNP (last 3 results) No results found for this basename: PROBNP,  in the last 8760 hours CBG:  Recent Labs Lab 03/17/14 2028 03/18/14 0001 03/18/14 0356 03/18/14 0648 03/18/14 1643  GLUCAP 140* 169* 185* 207* 235*    No results found for this or any previous visit (from the past 240 hour(s)).   Studies: Dg Chest 2 View  03/16/2014   CLINICAL DATA:  Cough and wheeze for 3 days  EXAM: CHEST  2 VIEW  COMPARISON:  01/18/2014  FINDINGS: Hypoaeration with diffuse interstitial coarsening. There is mild cardiomegaly and upper mediastinal widening, accentuated by low lung volumes. The lower esophagus is dilated by gas in the lateral projection. No significant effusion. No pneumothorax. Negative osseous structures.  IMPRESSION: 1. Coarsened lung markings which could be congestive or bronchitic. 2. Low lung volumes.   Electronically Signed   By: Tiburcio Pea M.D.   On: 03/16/2014 23:13   Ct Abdomen Pelvis W Contrast  03/17/2014   CLINICAL DATA:  Bloody stools, suprapubic and left lower quadrant pain  EXAM: CT ABDOMEN AND  PELVIS WITH CONTRAST  TECHNIQUE: Multidetector CT imaging of the abdomen and pelvis was performed using the standard protocol following bolus administration of intravenous contrast.  CONTRAST:  OMNIPAQUE IOHEXOL 300 MG/ML  SOLN  COMPARISON:  Prior CT abdomen/ pelvis 01/06/2007  FINDINGS: Lower Chest: Dependent atelectasis in the lower lungs. Visualized cardiac structures within normal limits for size. No pericardial effusion. Unremarkable visualized distal thoracic esophagus.  Abdomen: Unremarkable CT appearance of the stomach, duodenum, spleen, adrenal glands and pancreas. Normal hepatic contours and morphology. No discrete hepatic lesion. Gallbladder is unremarkable. No intra or extrahepatic biliary ductal dilatation. Unremarkable appearance of the bilateral kidneys. No focal solid lesion, hydronephrosis or nephrolithiasis.  Image quality degradation secondary to motion predominantly affecting the lower abdomen an anatomic pelvis. No evidence of obstruction or focal bowel wall thickening. Normal appendix in the right lower quadrant. The terminal ileum is unremarkable. No significant colonic diverticular disease. No free fluid or suspicious adenopathy.  Pelvis: Motion degradation. Unremarkable bladder, prostate gland and seminal vesicles. No free fluid or suspicious adenopathy. Small fat containing right inguinal hernia.  Bones/Soft Tissues: No acute fracture or aggressive appearing lytic or blastic osseous lesion.  Vascular: Trace atherosclerotic vascular calcification without aneurysmal dilatation or significant appearing stenosis.  IMPRESSION: Mildly limited evaluation of the lower abdominal and pelvic contents secondary to patient motion.  No acute abnormality in the abdomen or pelvis to explain the patient's clinical symptoms.   Electronically Signed   By: Malachy Moan M.D.   On: 03/17/2014 00:30    Scheduled Meds: . acetaminophen-codeine  1 tablet Oral Once  . guaiFENesin  1,200 mg Oral BID   . hydrocortisone cream   Topical BID  . insulin aspart  0-9 Units Subcutaneous Q6H  . insulin glargine  4 Units Subcutaneous Daily  . ipratropium-albuterol  3 mL Nebulization TID  . levofloxacin (LEVAQUIN) IV  750 mg Intravenous Q24H  . nicotine  21 mg Transdermal Daily  . predniSONE  50 mg Oral Q breakfast  . sodium chloride  3 mL Intravenous Q12H   Continuous Infusions:       Ella Golomb  [facility] Pager [phone]. If 7PM-7AM, please contact night-coverage at [email or web address], password [facility] 03/18/2014, 6:23 PM  LOS: 2 days

## 2014-03-19 ENCOUNTER — Ambulatory Visit: Payer: 59 | Admitting: Diagnostic Neuroimaging

## 2014-03-19 ENCOUNTER — Inpatient Hospital Stay (HOSPITAL_COMMUNITY): Payer: 59

## 2014-03-19 DIAGNOSIS — E119 Type 2 diabetes mellitus without complications: Secondary | ICD-10-CM | POA: Diagnosis present

## 2014-03-19 LAB — CBC
HCT: 37.4 % — ABNORMAL LOW (ref 39.0–52.0)
Hemoglobin: 12.7 g/dL — ABNORMAL LOW (ref 13.0–17.0)
MCH: 32.5 pg (ref 26.0–34.0)
MCHC: 34 g/dL (ref 30.0–36.0)
MCV: 95.7 fL (ref 78.0–100.0)
Platelets: 215 10*3/uL (ref 150–400)
RBC: 3.91 MIL/uL — ABNORMAL LOW (ref 4.22–5.81)
RDW: 13.2 % (ref 11.5–15.5)
WBC: 13.1 10*3/uL — ABNORMAL HIGH (ref 4.0–10.5)

## 2014-03-19 LAB — BASIC METABOLIC PANEL
BUN: 13 mg/dL (ref 6–23)
CALCIUM: 9.2 mg/dL (ref 8.4–10.5)
CO2: 22 mEq/L (ref 19–32)
Chloride: 103 mEq/L (ref 96–112)
Creatinine, Ser: 0.7 mg/dL (ref 0.50–1.35)
Glucose, Bld: 210 mg/dL — ABNORMAL HIGH (ref 70–99)
Potassium: 3.7 mEq/L (ref 3.7–5.3)
SODIUM: 141 meq/L (ref 137–147)

## 2014-03-19 LAB — GLUCOSE, CAPILLARY
GLUCOSE-CAPILLARY: 206 mg/dL — AB (ref 70–99)
Glucose-Capillary: 196 mg/dL — ABNORMAL HIGH (ref 70–99)
Glucose-Capillary: 217 mg/dL — ABNORMAL HIGH (ref 70–99)

## 2014-03-19 LAB — HEMOGLOBIN A1C
Hgb A1c MFr Bld: 7.4 % — ABNORMAL HIGH (ref <5.7)
MEAN PLASMA GLUCOSE: 166 mg/dL — AB (ref <117)

## 2014-03-19 MED ORDER — HYDROCORTISONE 1 % EX CREA: TOPICAL_CREAM | Freq: Two times a day (BID) | CUTANEOUS | Status: DC

## 2014-03-19 MED ORDER — DOCUSATE SODIUM 100 MG PO CAPS: 100.0000 mg | ORAL_CAPSULE | Freq: Two times a day (BID) | ORAL | Status: DC | PRN

## 2014-03-19 MED ORDER — DSS 100 MG PO CAPS: 100.0000 mg | ORAL_CAPSULE | Freq: Two times a day (BID) | ORAL | Status: AC | PRN

## 2014-03-19 MED ORDER — HYDROCODONE-ACETAMINOPHEN 10-325 MG PO TABS: 1.0000 | ORAL_TABLET | ORAL | Status: AC | PRN

## 2014-03-19 MED ORDER — PREDNISONE 10 MG PO TABS: ORAL_TABLET | ORAL | Status: DC

## 2014-03-19 MED ORDER — LEVOFLOXACIN 750 MG PO TABS: 750.0000 mg | ORAL_TABLET | Freq: Every day | ORAL | Status: DC

## 2014-03-19 MED ORDER — ACETAMINOPHEN 325 MG PO TABS
650.0000 mg | ORAL_TABLET | Freq: Four times a day (QID) | ORAL | Status: DC | PRN
  Administered 2014-03-19: 650 mg via ORAL

## 2014-03-19 MED ORDER — ALBUTEROL SULFATE HFA 108 (90 BASE) MCG/ACT IN AERS: 2.0000 | INHALATION_SPRAY | Freq: Four times a day (QID) | RESPIRATORY_TRACT | Status: DC | PRN

## 2014-03-19 MED ORDER — AMLODIPINE BESYLATE 5 MG PO TABS: 5.0000 mg | ORAL_TABLET | Freq: Every day | ORAL | Status: DC

## 2014-03-19 MED ORDER — LACTULOSE 10 GM/15ML PO SOLN
20.0000 g | Freq: Once | ORAL | Status: AC
  Administered 2014-03-19: 20 g via ORAL
  Filled 2014-03-19: qty 30

## 2014-03-19 MED ORDER — LEVOFLOXACIN 750 MG PO TABS
750.0000 mg | ORAL_TABLET | Freq: Every day | ORAL | Status: DC
  Administered 2014-03-19: 750 mg via ORAL
  Filled 2014-03-19: qty 1

## 2014-03-19 MED ORDER — FLUTICASONE-SALMETEROL 250-50 MCG/DOSE IN AEPB: 1.0000 | INHALATION_SPRAY | Freq: Two times a day (BID) | RESPIRATORY_TRACT | Status: AC

## 2014-03-19 MED ORDER — GUAIFENESIN ER 600 MG PO TB12: 1200.0000 mg | ORAL_TABLET | Freq: Two times a day (BID) | ORAL | Status: DC

## 2014-03-19 MED ORDER — AMLODIPINE BESYLATE 5 MG PO TABS: 5.0000 mg | ORAL_TABLET | Freq: Every day | ORAL | Status: AC

## 2014-03-19 NOTE — Discharge Summary (Signed)
Physician Discharge Summary  Brandon Alvarez ZOX:[id number] DOB: May 19, 1961 DOA: 03/16/2014  PCP: Dema Severin, NP  Admit date: 03/16/2014 Discharge date: 03/19/2014  Time spent: 35 minutes  Recommendations for Outpatient Follow-up:  1. If continues to have cough, consider referral to pulmonary.  Discharge Diagnoses:  Active Problems:   Smoker   Loss of weight   Hemorrhoids   DM2 (diabetes mellitus, type 2)   Discharge Condition: Stable  Diet recommendation: Heart healthy, carb consistent.  Filed Weights   03/17/14 0208 03/18/14 0500 03/19/14 0533  Weight: 89.7 kg (197 lb 12 oz) 88.043 kg (194 lb 1.6 oz) 87.8 kg (193 lb 9 oz)    Hospital Course:  Brandon Alvarez is a 53 y.o. male with Past medical history of testicular cancer, alcohol abuse, tobacco abuse,DM2, and Htn who presented with complaints of bright red blood per rectum as well as melena. He also reported  shortness of breath which was progressively worsening from the baseline and was associated with dry cough.. He was found to have hemorrhoids after EGD and Colonoscopy, and also to have acute exacerbation of COPD. He had a persistent cough prompting CT of the chest which did not show any obvious muscle consolidation. Irrespective, patient was placed on Levaquin,systemic steroids and bronchodilators. He was given Preparation H for the hemorrhoids. He was also started on Norvasc for uncontrolled hypertension. He is discharged to follow with his primary care provider in the next week or 2. Heis discharged in relatively stable condition.  Procedures:  EGD/Colonoscopy  Consultations:  GI  Discharge Exam: Filed Vitals:   03/19/14 1459  BP: 145/67  Pulse: 87  Temp: 98.3 F (36.8 C)  Resp: 18    Discharge Instructions You were cared for by a hospitalist during your hospital stay. If you have any questions about your discharge medications or the care you received while you were in the hospital after you are discharged, you can  call the unit and asked to speak with the hospitalist on call if the hospitalist that took care of you is not available. Once you are discharged, your primary care physician will handle any further medical issues. Please note that NO REFILLS for any discharge medications will be authorized once you are discharged, as it is imperative that you return to your primary care physician (or establish a relationship with a primary care physician if you do not have one) for your aftercare needs so that they can reassess your need for medications and monitor your lab values.     Medication List         albuterol 108 (90 BASE) MCG/ACT inhaler  Commonly known as:  PROVENTIL HFA;VENTOLIN HFA  Inhale 2 puffs into the lungs every 6 (six) hours as needed for wheezing or shortness of breath.     amLODipine 5 MG tablet  Commonly known as:  NORVASC  Take 1 tablet (5 mg total) by mouth daily.     CENTRUM SILVER ADULT 50+ PO  Take 1 tablet by mouth daily.     DSS 100 MG Caps  Take 100 mg by mouth 2 (two) times daily as needed for mild constipation.     Fluticasone-Salmeterol 250-50 MCG/DOSE Aepb  Commonly known as:  ADVAIR DISKUS  Inhale 1 puff into the lungs 2 (two) times daily.     guaiFENesin 600 MG 12 hr tablet  Commonly known as:  MUCINEX  Take 2 tablets (1,200 mg total) by mouth 2 (two) times daily.     HYDROcodone-acetaminophen 10-325 MG  per tablet  Commonly known as:  NORCO  Take 1 tablet by mouth every 3 (three) hours as needed.     hydrocortisone cream 1 %  Apply topically 2 (two) times daily.     levofloxacin 750 MG tablet  Commonly known as:  LEVAQUIN  Take 1 tablet (750 mg total) by mouth daily.     metFORMIN 500 MG tablet  Commonly known as:  GLUCOPHAGE  Take 500 mg by mouth 2 (two) times daily.     omeprazole 20 MG capsule  Commonly known as:  PRILOSEC  Take 20 mg by mouth daily.     predniSONE 10 MG tablet  Commonly known as:  DELTASONE  Please take 4 tablets daily for 2  days, then 3 tablets daily for 2 days, then 2 tablets daily for 2 days then 1 tablet daily for 2 days, total twenty tablets       No Known Allergies    The results of significant diagnostics from this hospitalization (including imaging, microbiology, ancillary and laboratory) are listed below for reference.    Significant Diagnostic Studies: Dg Chest 2 View  03/16/2014   CLINICAL DATA:  Cough and wheeze for 3 days  EXAM: CHEST  2 VIEW  COMPARISON:  01/18/2014  FINDINGS: Hypoaeration with diffuse interstitial coarsening. There is mild cardiomegaly and upper mediastinal widening, accentuated by low lung volumes. The lower esophagus is dilated by gas in the lateral projection. No significant effusion. No pneumothorax. Negative osseous structures.  IMPRESSION: 1. Coarsened lung markings which could be congestive or bronchitic. 2. Low lung volumes.   Electronically Signed   By: Tiburcio PeaJonathan  Watts M.D.   On: 03/16/2014 23:13   Ct Chest Wo Contrast  03/19/2014   CLINICAL DATA:  Persistent cough  EXAM: CT CHEST WITHOUT CONTRAST  TECHNIQUE: Multidetector CT imaging of the chest was performed following the standard protocol without IV contrast.  COMPARISON:  Chest radiograph on 03/16/2014  FINDINGS: Low lung volumes again noted, however there is no evidence of pulmonary airspace disease. No suspicious pulmonary nodules or masses are identified. No signs of interstitial lung disease or fibrosis noted. No evidence of bronchiectasis.  No evidence of pleural or pericardial effusion. No evidence of hilar or mediastinal masses. No adenopathy seen elsewhere within the thorax.  IMPRESSION: Low lung volumes. No active lung disease or other significant abnormality identified within the thorax.   Electronically Signed   By: Myles RosenthalJohn  Stahl M.D.   On: 03/19/2014 08:52   Ct Abdomen Pelvis W Contrast  03/17/2014   CLINICAL DATA:  Bloody stools, suprapubic and left lower quadrant pain  EXAM: CT ABDOMEN AND PELVIS WITH CONTRAST   TECHNIQUE: Multidetector CT imaging of the abdomen and pelvis was performed using the standard protocol following bolus administration of intravenous contrast.  CONTRAST:  100mL OMNIPAQUE IOHEXOL 300 MG/ML  SOLN  COMPARISON:  Prior CT abdomen/ pelvis 01/06/2007  FINDINGS: Lower Chest: Dependent atelectasis in the lower lungs. Visualized cardiac structures within normal limits for size. No pericardial effusion. Unremarkable visualized distal thoracic esophagus.  Abdomen: Unremarkable CT appearance of the stomach, duodenum, spleen, adrenal glands and pancreas. Normal hepatic contours and morphology. No discrete hepatic lesion. Gallbladder is unremarkable. No intra or extrahepatic biliary ductal dilatation. Unremarkable appearance of the bilateral kidneys. No focal solid lesion, hydronephrosis or nephrolithiasis.  Image quality degradation secondary to motion predominantly affecting the lower abdomen an anatomic pelvis. No evidence of obstruction or focal bowel wall thickening. Normal appendix in the right lower quadrant. The terminal  ileum is unremarkable. No significant colonic diverticular disease. No free fluid or suspicious adenopathy.  Pelvis: Motion degradation. Unremarkable bladder, prostate gland and seminal vesicles. No free fluid or suspicious adenopathy. Small fat containing right inguinal hernia.  Bones/Soft Tissues: No acute fracture or aggressive appearing lytic or blastic osseous lesion.  Vascular: Trace atherosclerotic vascular calcification without aneurysmal dilatation or significant appearing stenosis.  IMPRESSION: Mildly limited evaluation of the lower abdominal and pelvic contents secondary to patient motion.  No acute abnormality in the abdomen or pelvis to explain the patient's clinical symptoms.   Electronically Signed   By: Malachy Moan M.D.   On: 03/17/2014 00:30    Microbiology: No results found for this or any previous visit (from the past 240 hour(s)).   Labs: Basic Metabolic  Panel:  Recent Labs Lab 03/16/14 2151 03/17/14 0349 03/18/14 0554 03/19/14 0412  NA 144 140 143 141  K 3.9 4.0 3.9 3.7  CL 103 103 106 103  CO2 24 21 21 22   GLUCOSE 159* 211* 210* 210*  BUN 8 9 11 13   CREATININE 0.80 0.69 0.63 0.70  CALCIUM 9.3 9.6 9.5 9.2  MG  --   --  2.2  --   PHOS  --   --  3.2  --    Liver Function Tests:  Recent Labs Lab 03/16/14 2151 03/17/14 0349 03/18/14 0554  AST 22 21 24   ALT 49 44 46  ALKPHOS 84 79 76  BILITOT 0.3 0.3 0.4  PROT 7.5 6.9 7.1  ALBUMIN 4.0 3.7 3.9   No results found for this basename: LIPASE, AMYLASE,  in the last 168 hours No results found for this basename: AMMONIA,  in the last 168 hours CBC:  Recent Labs Lab 03/16/14 2151 03/17/14 0349 03/18/14 0554 03/19/14 0412  WBC 9.8 8.6 12.2* 13.1*  NEUTROABS  --  7.5  --   --   HGB 13.9 12.8* 12.8* 12.7*  HCT 39.3 38.1* 36.8* 37.4*  MCV 94.9 95.7 94.4 95.7  PLT 191 174 199 215   Cardiac Enzymes: No results found for this basename: CKTOTAL, CKMB, CKMBINDEX, TROPONINI,  in the last 168 hours BNP: BNP (last 3 results) No results found for this basename: PROBNP,  in the last 8760 hours CBG:  Recent Labs Lab 03/18/14 1643 03/18/14 2145 03/19/14 0607 03/19/14 1101 03/19/14 1606  GLUCAP 235* 226* 196* 217* 206*       Signed:  Donnika Kucher  [facility] 03/19/2014, 5:02 PM

## 2014-03-19 NOTE — Progress Notes (Signed)
Discharged to home with family office visits in place teaching done  

## 2014-03-19 NOTE — Progress Notes (Signed)
Inpatient Diabetes Program Recommendations  AACE/ADA: New Consensus Statement on Inpatient Glycemic Control (2013)  Target Ranges:  Prepandial:   less than 140 mg/dL      Peak postprandial:   less than 180 mg/dL (1-2 hours)      Critically ill patients:  140 - 180 mg/dL   Inpatient Diabetes Program Recommendations Insulin - Basal: xxx Insulin - Meal Coverage: Now that patient is on prednisne, please add meal coverage of 3-4 units tidwc to start. HgbA1C: Please check current HgbA1C. Last one was in August of 2014 at 7.9%.  Thank you, Lenor CoffinAnn Khristi Schiller, RN, CNS, Diabetes Coordinator [phone])

## 2014-03-22 ENCOUNTER — Encounter (HOSPITAL_COMMUNITY): Payer: Self-pay | Admitting: Gastroenterology

## 2014-04-06 ENCOUNTER — Encounter: Payer: Self-pay | Admitting: Neurology

## 2014-04-06 ENCOUNTER — Ambulatory Visit (INDEPENDENT_AMBULATORY_CARE_PROVIDER_SITE_OTHER): Payer: 59 | Admitting: Neurology

## 2014-04-06 VITALS — BP 124/87 | HR 87 | Ht 69.0 in | Wt 199.0 lb

## 2014-04-06 DIAGNOSIS — R519 Headache, unspecified: Secondary | ICD-10-CM | POA: Insufficient documentation

## 2014-04-06 DIAGNOSIS — R51 Headache: Secondary | ICD-10-CM

## 2014-04-06 HISTORY — DX: Headache: R51

## 2014-04-06 MED ORDER — TOPIRAMATE 25 MG PO TABS: ORAL_TABLET | ORAL | Status: AC

## 2014-04-06 NOTE — Progress Notes (Signed)
Reason for visit: Headache  Brandon Alvarez is a 53 y.o. male  History of present illness:  Mr. Brandon Alvarez is a 53 year old right-handed Hispanic male with a history of a work-related accident that occurred in August of 2013. The patient bumped the right side of his head, and hurt his left arm and shoulder. The patient has had headaches since that time. The patient indicates that the headaches may last an entire week at a time, and he will have up to 15 headaches a month. The headaches usually start in the right frontotemporal area, and then spread backwards into the right occipital and right cervical area. The patient does have some neck discomfort as well. The patient indicates that he may have a pressure sensation with the headache, and headaches often times come on in the morning out of sleep. The patient may have nausea, and blurred vision. The patient has photophobia and phonophobia with the headache and scalp tenderness with the headache. The patient denies any particular activating factors for the headache. The patient has a history of a TIA event affecting the right side, and the MRI of the brain was unremarkable. The patient has had other issues with bleeding from the right ear, and he had an ENT evaluation. The patient was treated with antibiotics. The patient has recently had significant hemorrhoid bleeding, and he had to come off of his low-dose aspirin. The patient has hydrocodone to take if needed for the headache. The patient comes to this office for an evaluation.  Past Medical History  Diagnosis Date  . Tobacco abuse   . Alcohol abuse   . Trauma     left arm (shoulder and elbow) s/p surgery  . Diabetes   . Type 2 diabetes mellitus 2014  . Headache(784.0) 04/06/2014  . COPD (chronic obstructive pulmonary disease)   . Stroke     TIA    Past Surgical History  Procedure Laterality Date  . Elbow surgery Left     July 2014  . Hand surgery    . Shoulder surgery    .  Esophagogastroduodenoscopy (egd) with propofol N/A 03/17/2014    Procedure: ESOPHAGOGASTRODUODENOSCOPY (EGD) WITH PROPOFOL;  Surgeon: Rachael Feeaniel P Jacobs, MD;  Location: [facility] ENDOSCOPY;  Service: Endoscopy;  Laterality: N/A;  . Colonoscopy with propofol N/A 03/18/2014    Procedure: COLONOSCOPY WITH PROPOFOL;  Surgeon: Rachael Feeaniel P Jacobs, MD;  [facility] ENDOSCOPY;  Service: Endoscopy;  Laterality: N/A;  . Vasectomy      Family History  Problem Relation Age of Onset  . Stroke Maternal Grandmother   . Hypertension Father     Social history:  reports that he has been smoking Cigarettes.  He has been smoking about 0.50 packs per day. He has never used smokeless tobacco. He reports that he does not drink alcohol or use illicit drugs.  Medications:  Current Outpatient Prescriptions on File Prior to Visit  Medication Sig Dispense Refill  . amLODipine (NORVASC) 5 MG tablet Take 1 tablet (5 mg total) by mouth daily.  30 tablet  0  . docusate sodium 100 MG CAPS Take 100 mg by mouth 2 (two) times daily as needed for mild constipation.  20 capsule  0  . Fluticasone-Salmeterol (ADVAIR DISKUS) 250-50 MCG/DOSE AEPB Inhale 1 puff into the lungs 2 (two) times daily.  60 each  0  . HYDROcodone-acetaminophen (NORCO) 10-325 MG per tablet Take 1 tablet by mouth every 3 (three) hours as needed.  20 tablet  0  . metFORMIN (GLUCOPHAGE)  500 MG tablet Take 500 mg by mouth 2 (two) times daily.      . Multiple Vitamins-Minerals (CENTRUM SILVER ADULT 50+ PO) Take 1 tablet by mouth daily.      Marland Kitchen. omeprazole (PRILOSEC) 20 MG capsule Take 20 mg by mouth daily.       No current facility-administered medications on file prior to visit.     No Known Allergies  ROS:  Out of a complete 14 system review of symptoms, the patient complains only of the following symptoms, and all other reviewed systems are negative.  Weight loss Hearing loss Blurred vision, double vision, loss of vision Shortness of breath, cough Blood in the  stool Feeling hot, cold Headache  Blood pressure 124/87, pulse 87, height 5\' 9"  (1.753 m), weight 199 lb (90.266 kg).  Physical Exam  General: The patient is alert and cooperative at the time of the examination.  Eyes: Pupils are equal, round, and reactive to light. Discs are flat bilaterally.  Neck: The neck is supple, no carotid bruits are noted.  Respiratory: The respiratory examination is clear.  Cardiovascular: The cardiovascular examination reveals a regular rate and rhythm, no obvious murmurs or rubs are noted.  Skin: Extremities are without significant edema.  Neurologic Exam  Mental status: The patient is alert and oriented x 3 at the time of the examination. The patient has apparent normal recent and remote memory, with an apparently normal attention span and concentration ability.  Cranial nerves: Facial symmetry is not present. A slight depression of the right nasolabial fold is seen. There is good sensation of the face to pinprick and soft touch on the left, decreased on the right. The strength of the facial muscles and the muscles to head turning and shoulder shrug are normal bilaterally. Speech is well enunciated, no aphasia or dysarthria is noted. Extraocular movements are full. Visual fields are full. The tongue is midline, and the patient has symmetric elevation of the soft palate. No obvious hearing deficits are noted.  Motor: The motor testing reveals 5 over 5 strength of all 4 extremities. Good symmetric motor tone is noted throughout.  Sensory: Sensory testing is intact to pinprick, soft touch, vibration sensation, and position sense on all 4 extremities. No evidence of extinction is noted.  Coordination: Cerebellar testing reveals good finger-nose-finger and heel-to-shin bilaterally.  Gait and station: Gait is normal. Tandem gait is normal. Romberg is negative. No drift is seen.  Reflexes: Deep tendon reflexes are symmetric and normal bilaterally. Toes are  downgoing bilaterally.   MRI cervical spine 07/25/2013:  IMPRESSION:  Mild degenerative changes C6-7 as detailed above.   CT brain 01/18/2014:  IMPRESSION:  1. Stable appearance the brain with patchy periventricular and  subcortical white matter disease. No acute intracranial findings.  2. No specific explanation for right ear bleeding identified. There  is minimal soft tissue density within the right external auditory  canal.     Assessment/Plan:  1. 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 Mild cervical spondylosis  The patient has what appears to be migraine headache that may have been initiated from the head trauma. The patient denies any significant history of headaches prior to the work-related injury. The patient will be placed on Topamax, and he will take hydrocodone if needed for the headache. The patient will followup through this office in about 4 months. If the headaches do not abate, the patient could be a candidate for the use of Botox injections.  Marlan Palau. Keith Willis MD 04/06/2014 8:09 PM  [facility] [address] [place], [place] [address]  Phone [phone] Fax [phone]

## 2014-04-06 NOTE — Patient Instructions (Signed)
Topamax (topiramate) is a seizure medication that has an FDA approval for seizures and for migraine headache. Potential side effects of this medication include weight loss, cognitive slowing, tingling in the fingers and toes, and carbonated drinks will taste bad. If any significant side effects are noted on this drug, please contact our office.     Migraine Headache A migraine headache is an intense, throbbing pain on one or both sides of your head. A migraine can last for 30 minutes to several hours. CAUSES  The exact cause of a migraine headache is not always known. However, a migraine may be caused when nerves in the brain become irritated and release chemicals that cause inflammation. This causes pain. Certain things may also trigger migraines, such as:  Alcohol.  Smoking.  Stress.  Menstruation.  Aged cheeses.  Foods or drinks that contain nitrates, glutamate, aspartame, or tyramine.  Lack of sleep.  Chocolate.  Caffeine.  Hunger.  Physical exertion.  Fatigue.  Medicines used to treat chest pain (nitroglycerine), birth control pills, estrogen, and some blood pressure medicines. SIGNS AND SYMPTOMS  Pain on one or both sides of your head.  Pulsating or throbbing pain.  Severe pain that prevents daily activities.  Pain that is aggravated by any physical activity.  Nausea, vomiting, or both.  Dizziness.  Pain with exposure to bright lights, loud noises, or activity.  General sensitivity to bright lights, loud noises, or smells. Before you get a migraine, you may get warning signs that a migraine is coming (aura). An aura may include:  Seeing flashing lights.  Seeing bright spots, halos, or zig-zag lines.  Having tunnel vision or blurred vision.  Having feelings of numbness or tingling.  Having trouble talking.  Having muscle weakness. DIAGNOSIS  A migraine headache is often diagnosed based on:  Symptoms.  Physical exam.  A CT scan or MRI of  your head. These imaging tests cannot diagnose migraines, but they can help rule out other causes of headaches. TREATMENT Medicines may be given for pain and nausea. Medicines can also be given to help prevent recurrent migraines.  HOME CARE INSTRUCTIONS  Only take over-the-counter or prescription medicines for pain or discomfort as directed by your health care provider. The use of long-term narcotics is not recommended.  Lie down in a dark, quiet room when you have a migraine.  Keep a journal to find out what may trigger your migraine headaches. For example, write down:  What you eat and drink.  How much sleep you get.  Any change to your diet or medicines.  Limit alcohol consumption.  Quit smoking if you smoke.  Get 7 9 hours of sleep, or as recommended by your health care provider.  Limit stress.  Keep lights dim if bright lights bother you and make your migraines worse. SEEK IMMEDIATE MEDICAL CARE IF:   Your migraine becomes severe.  You have a fever.  You have a stiff neck.  You have vision loss.  You have muscular weakness or loss of muscle control.  You start losing your balance or have trouble walking.  You feel faint or pass out.  You have severe symptoms that are different from your first symptoms. MAKE SURE YOU:   Understand these instructions.  Will watch your condition.  Will get help right away if you are not doing well or get worse. Document Released: 12/03/2005 Document Revised: 09/23/2013 Document Reviewed: 08/10/2013 [employer] Patient Information 2014 [employer]  

## 2014-05-11 ENCOUNTER — Telehealth: Payer: Self-pay | Admitting: Neurology

## 2014-05-11 NOTE — Telephone Encounter (Signed)
Patient calling and stated headaches are returning, he's also experiencing vertigo along with memory loss.  Please call and advise.  Thanks  °

## 2014-05-11 NOTE — Telephone Encounter (Signed)
Patient calling and stated headaches are returning, he's also experiencing vertigo along with memory loss.  Please call and advise.  Thanks

## 2014-05-11 NOTE — Telephone Encounter (Signed)
I called patient. The patient indicates that the headaches are continuing, and he is having a lot of vertigo associated with falling. His gait was normal on 04/06/2014. The patient is having some memory issues on the Topamax. I'll try to get work in revisit for him.

## 2014-05-12 ENCOUNTER — Telehealth: Payer: Self-pay | Admitting: *Deleted

## 2014-05-12 NOTE — Telephone Encounter (Signed)
I've called patient twice today to offer f/u appointment. Dr. Anne Hahn had 11:00 am cancellation patient needs more notice to arrange transportation. I called again to offer 3:30 pm om 6/2. Patient declined he needs morning appointment, made him aware that we are scheduling from patient cancellations.

## 2014-05-19 ENCOUNTER — Ambulatory Visit: Payer: Self-pay | Admitting: Neurology

## 2014-05-19 ENCOUNTER — Telehealth: Payer: Self-pay | Admitting: Neurology

## 2014-05-19 NOTE — Telephone Encounter (Signed)
This patient did not show for a revisit appointment today. The patient had requested an urgent work in revisit. This was set up for this morning, the patient had indicated that he could not make an afternoon appointment. After setting up an urgent revisit appointment, the patient did not show. Further revisits will be with NP.

## 2014-08-06 ENCOUNTER — Ambulatory Visit: Payer: 59 | Admitting: Adult Health

## 2014-08-06 ENCOUNTER — Telehealth: Payer: Self-pay | Admitting: Adult Health

## 2014-08-06 NOTE — Telephone Encounter (Signed)
The patient no showed for a revisit appointment. This is his second no show.

## 2015-01-27 ENCOUNTER — Encounter: Payer: Self-pay | Admitting: Neurology

## 2015-03-18 DIAGNOSIS — Z0289 Encounter for other administrative examinations: Secondary | ICD-10-CM

## 2015-04-07 IMAGING — CR DG CHEST 2V
1 series · 1 of 1 positions shown · non-contrast
Comparison: Prior chest x-ray 08/01/2007

CLINICAL DATA: Stroke

CHEST - 2 VIEW

[w chest lat]
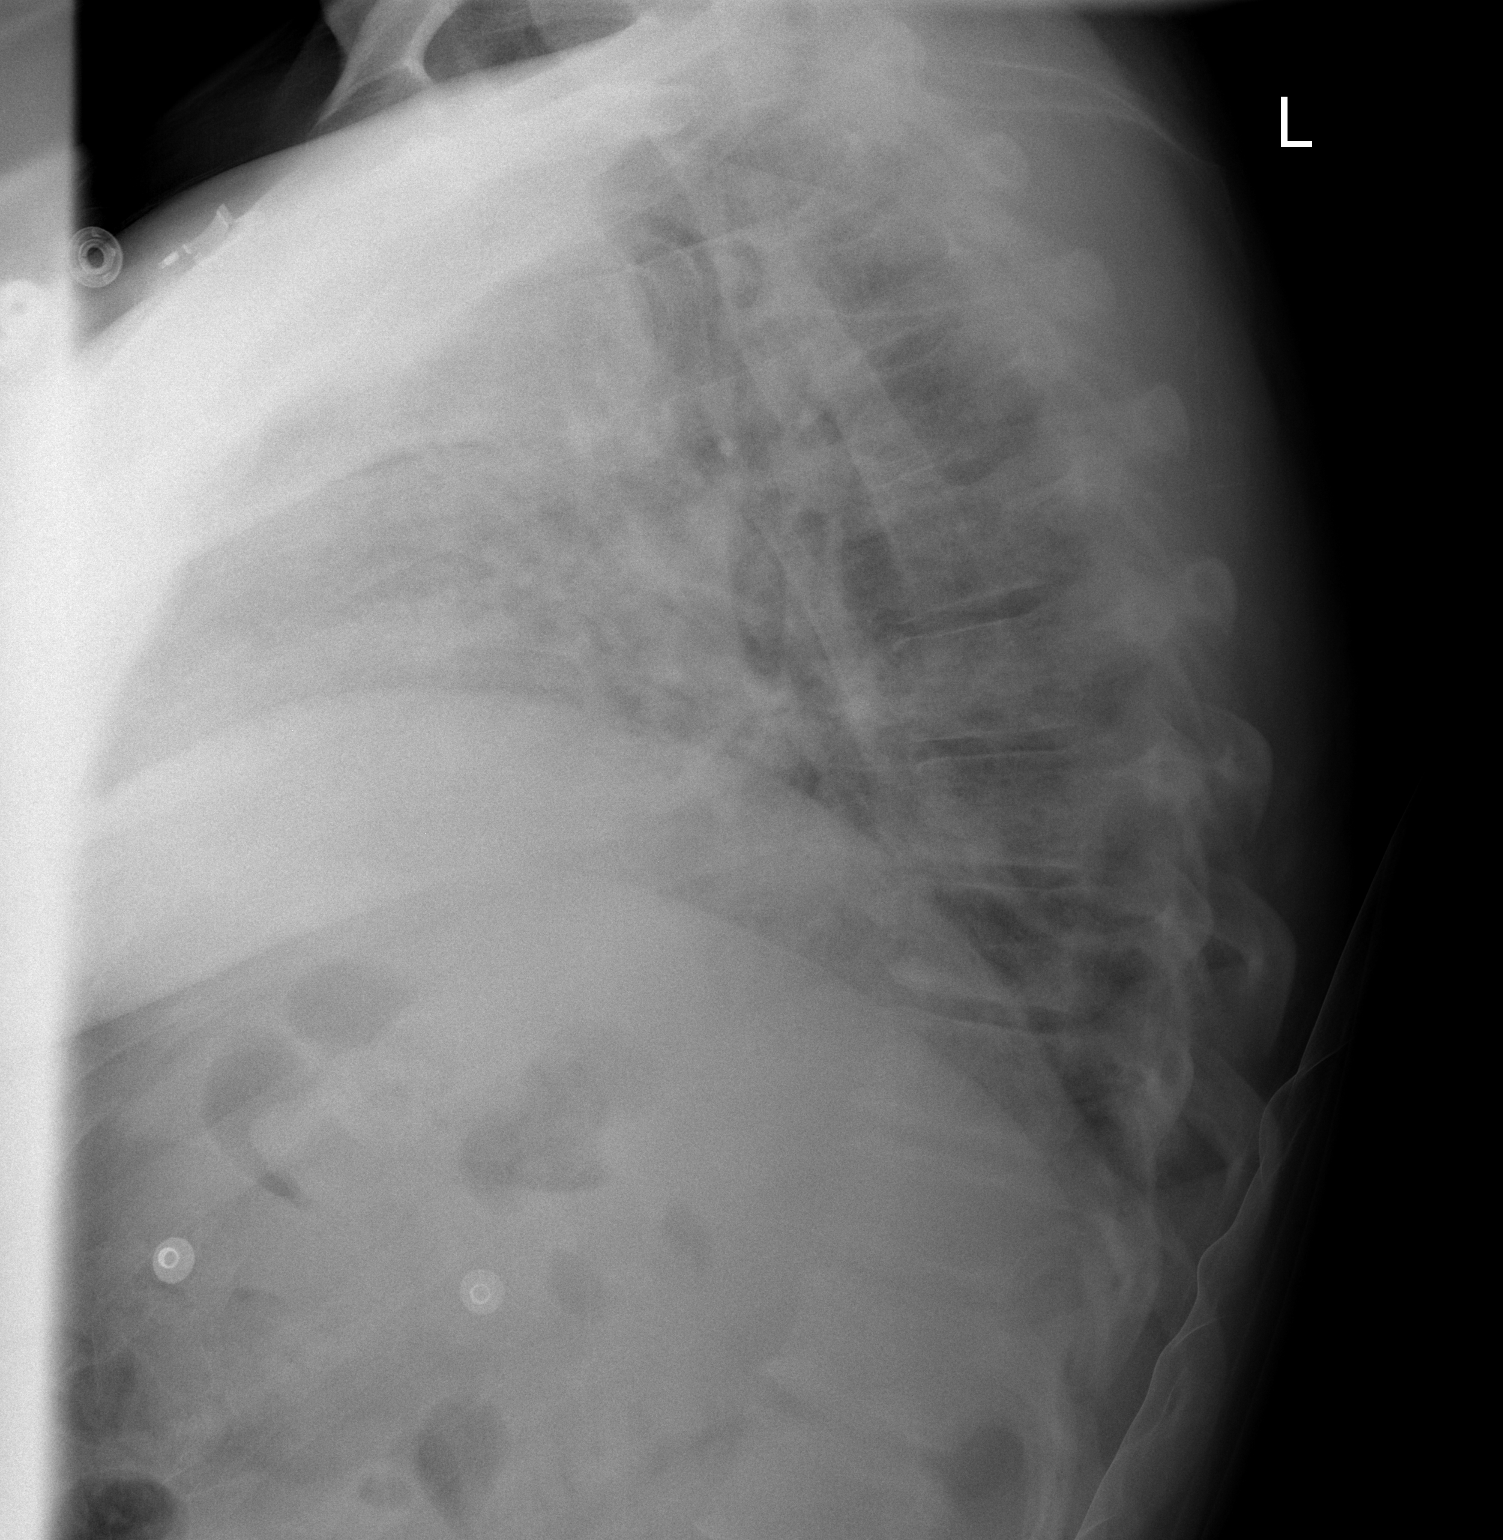

[1 of 1 positions shown; findings below may reference images not displayed]

FINDINGS: Very low inspiratory volumes with crowding of the
pulmonary vasculature.  Interstitial prominence is exaggerated
compared to prior.  Cardiac and mediastinal contours remain within
normal limits.  No pneumothorax or pleural effusion.
IMPRESSION: Very low inspiratory volumes with bibasilar atelectasis and
crowding of the pulmonary vasculature.

## 2015-04-07 IMAGING — CT CT HEAD W/O CM
2 series · 15 of 30 positions shown, 19 images · non-contrast
Comparison: Head CT 08/01/2007.

CLINICAL DATA: Headache.  Extremity weakness.

CT HEAD WITHOUT CONTRAST
TECHNIQUE: Contiguous axial images were obtained from the base of
the skull through the vertex without contrast.

[Series 2: head w/o · axial · non-contrast · 0.49mm/px · z∈[+1064,+1194]mm · 13 of 32 slices shown, 17 images]
[im 3/32  brain]
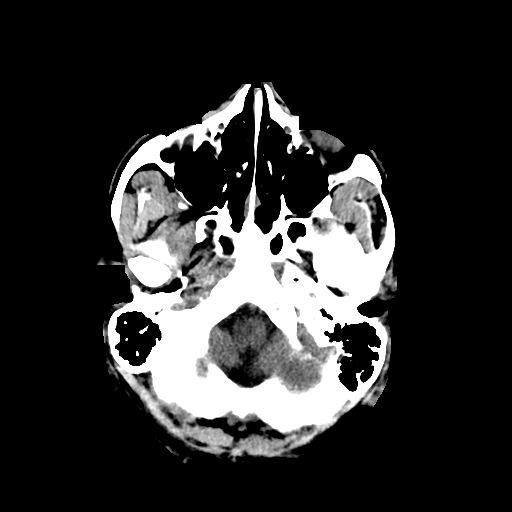
[im 3/32  bone]
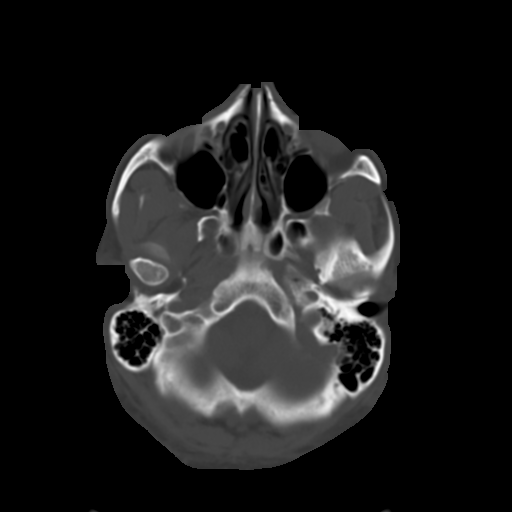
[im 5/32  brain]
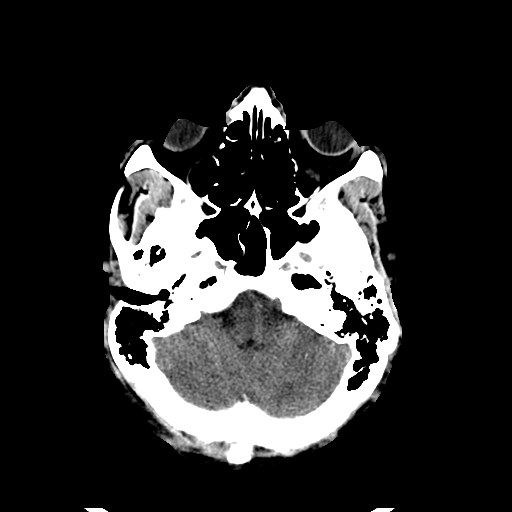
[im 7/32  brain]
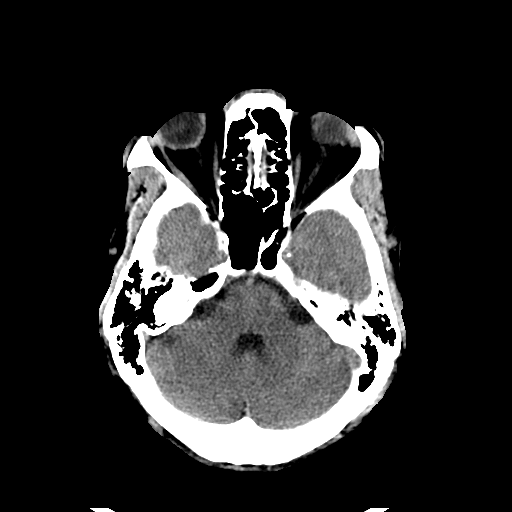
[im 9/32  brain]
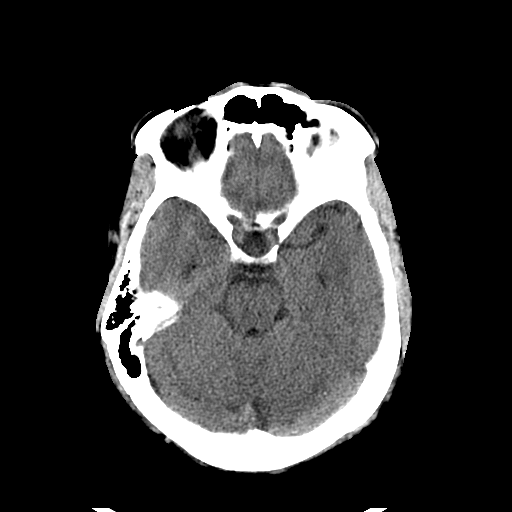
[im 12/32  brain]
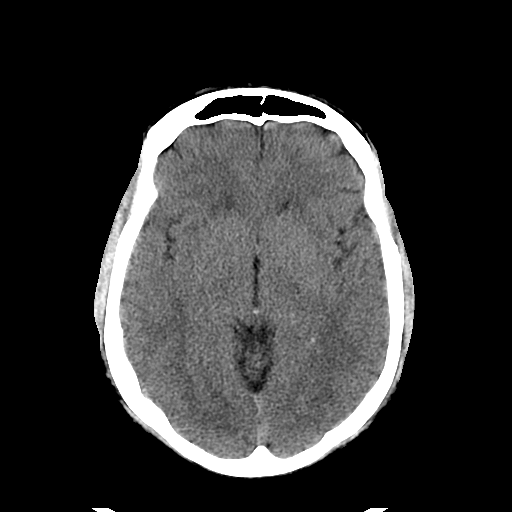
[im 12/32  bone]
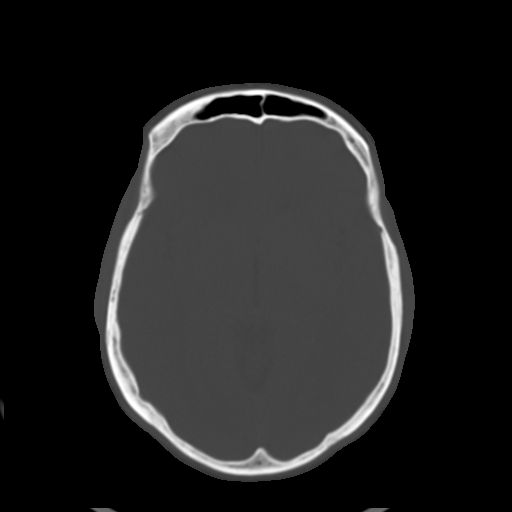
[im 14/32  brain]
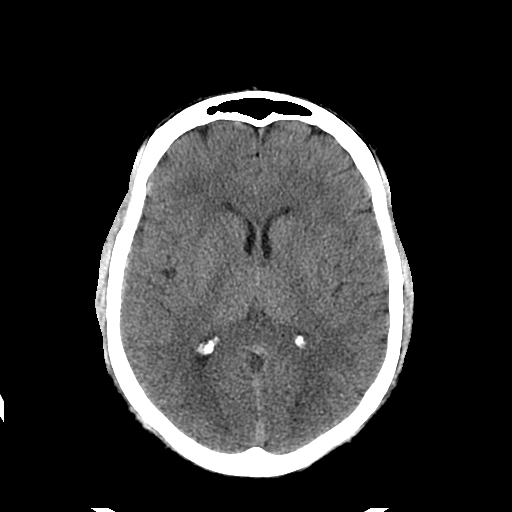
[im 16/32  brain]
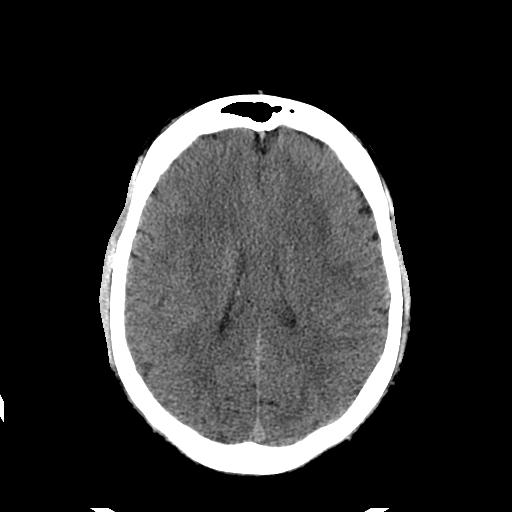
[im 18/32  brain]
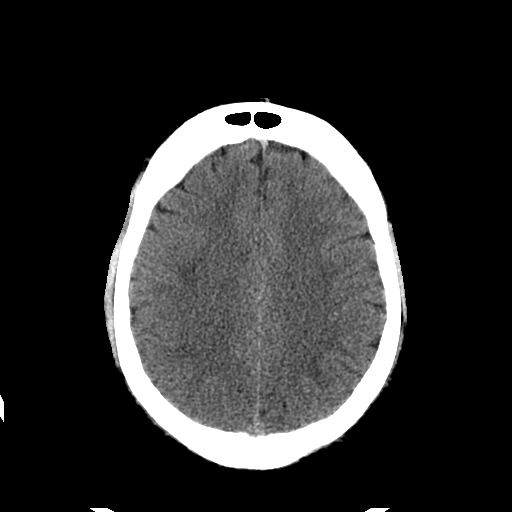
[im 20/32  brain]
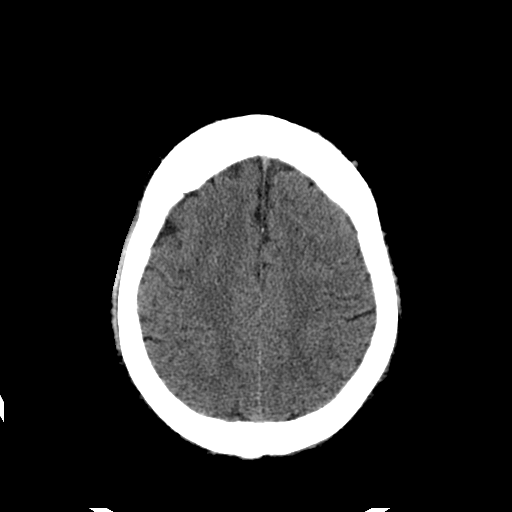
[im 20/32  bone]
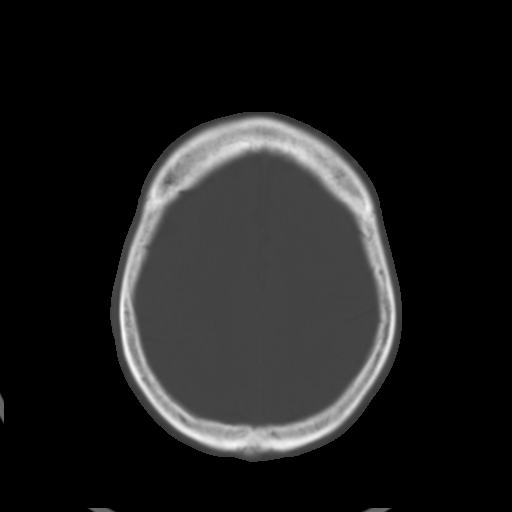
[im 23/32  brain]
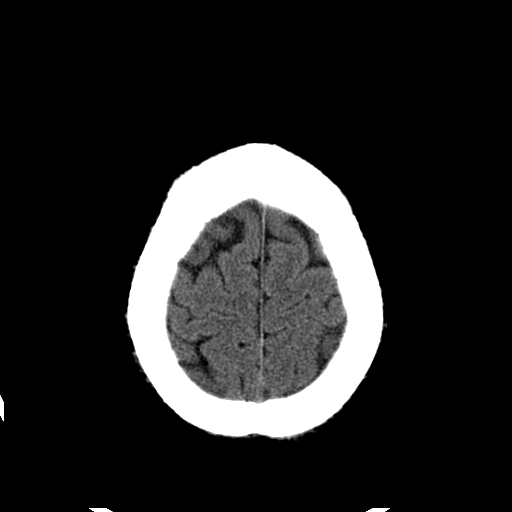
[im 25/32  brain]
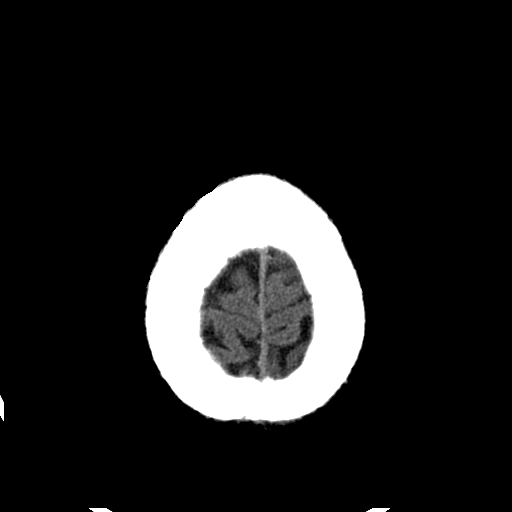
[im 27/32  brain]
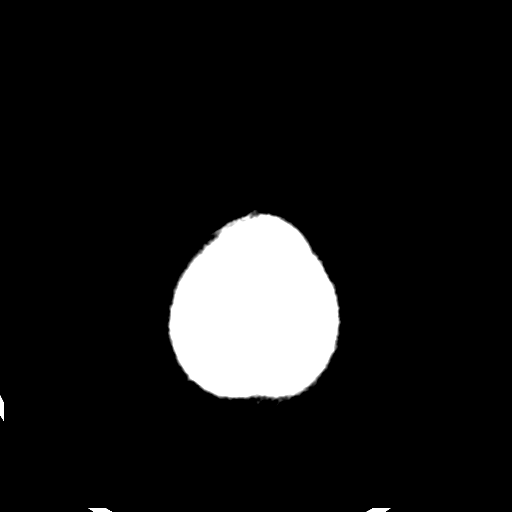
[im 29/32  brain]
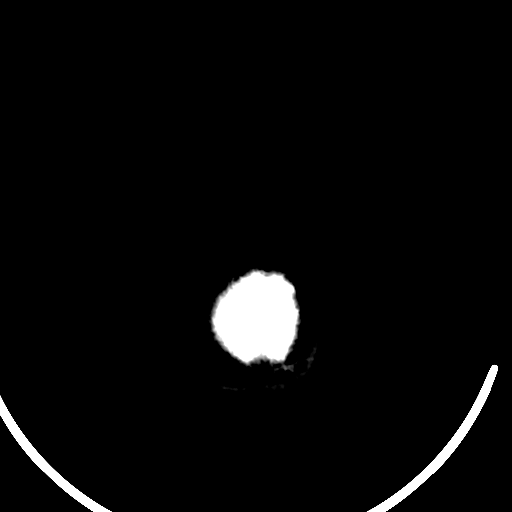
[im 29/32  bone]
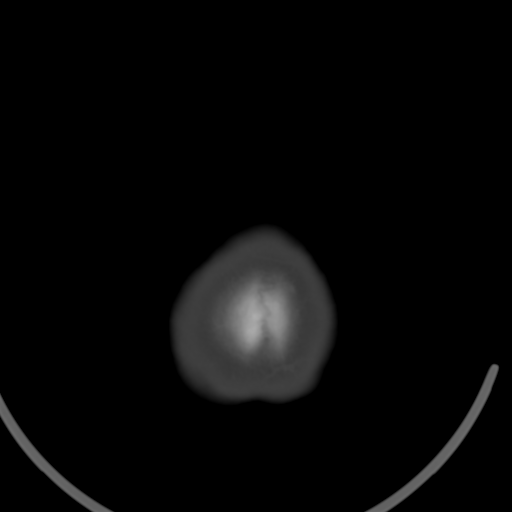

[Series 3: head w/o bone · axial · non-contrast · 0.49mm/px · z∈[+1064,+1084]mm · 2 of 32 slices shown]
[im 3/32  bone]
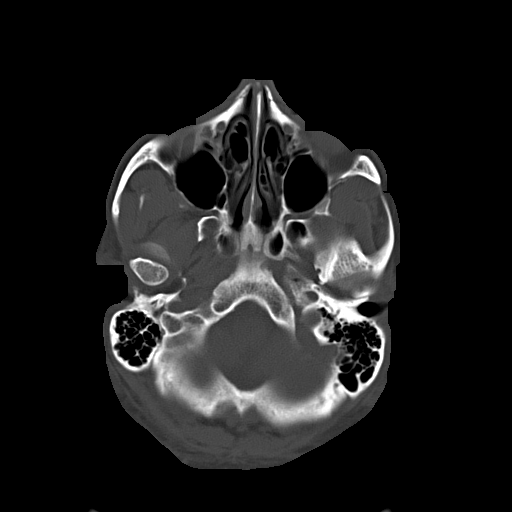
[im 7/32  bone]
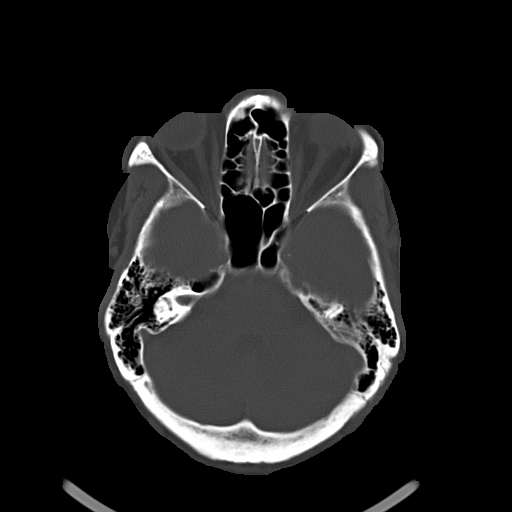

[15 of 30 positions shown; findings below may reference images not displayed]

FINDINGS: There are patchy and confluent areas of decreased
attenuation throughout the deep and periventricular white matter of
the cerebral hemispheres bilaterally, most compatible with chronic
microvascular ischemic disease. No acute intracranial
abnormalities.  Specifically, no evidence of acute intracranial
hemorrhage, no definite findings of acute/subacute cerebral
ischemia, no mass, mass effect, hydrocephalus or abnormal intra or
extra-axial fluid collections.  Visualized paranasal sinuses and
mastoids are well pneumatized.  No acute displaced skull fractures
are identified.
IMPRESSION: 1.  No acute intracranial abnormalities.
2.  Chronic microvascular ischemic changes in the cerebral white
matter, as above.
